# Patient Record
Sex: Male | Born: 1935 | Race: White | Hispanic: No | Marital: Single | Smoking: Former smoker
Health system: Southern US, Community
[De-identification: ages and names within clinical notes are randomized; demographics above are authoritative.]

## PROBLEM LIST (undated history)

## (undated) DIAGNOSIS — Z7901 Long term (current) use of anticoagulants: Secondary | ICD-10-CM

## (undated) DIAGNOSIS — G8911 Acute pain due to trauma: Secondary | ICD-10-CM

## (undated) DIAGNOSIS — J90 Pleural effusion, not elsewhere classified: Secondary | ICD-10-CM

## (undated) DIAGNOSIS — D649 Anemia, unspecified: Secondary | ICD-10-CM

## (undated) DIAGNOSIS — S72001A Fracture of unspecified part of neck of right femur, initial encounter for closed fracture: Secondary | ICD-10-CM

## (undated) DIAGNOSIS — R600 Localized edema: Secondary | ICD-10-CM

## (undated) DIAGNOSIS — F329 Major depressive disorder, single episode, unspecified: Secondary | ICD-10-CM

## (undated) DIAGNOSIS — H9190 Unspecified hearing loss, unspecified ear: Secondary | ICD-10-CM

## (undated) DIAGNOSIS — I519 Heart disease, unspecified: Secondary | ICD-10-CM

## (undated) DIAGNOSIS — S72001D Fracture of unspecified part of neck of right femur, subsequent encounter for closed fracture with routine healing: Secondary | ICD-10-CM

## (undated) DIAGNOSIS — R5381 Other malaise: Secondary | ICD-10-CM

## (undated) DIAGNOSIS — I10 Essential (primary) hypertension: Secondary | ICD-10-CM

## (undated) DIAGNOSIS — H409 Unspecified glaucoma: Secondary | ICD-10-CM

## (undated) DIAGNOSIS — I499 Cardiac arrhythmia, unspecified: Secondary | ICD-10-CM

## (undated) DIAGNOSIS — I4891 Unspecified atrial fibrillation: Secondary | ICD-10-CM

## (undated) DIAGNOSIS — F32A Depression, unspecified: Secondary | ICD-10-CM

## (undated) DIAGNOSIS — I639 Cerebral infarction, unspecified: Secondary | ICD-10-CM

## (undated) DIAGNOSIS — Z950 Presence of cardiac pacemaker: Secondary | ICD-10-CM

## (undated) DIAGNOSIS — I350 Nonrheumatic aortic (valve) stenosis: Secondary | ICD-10-CM

## (undated) DIAGNOSIS — F23 Brief psychotic disorder: Secondary | ICD-10-CM

## (undated) DIAGNOSIS — D62 Acute posthemorrhagic anemia: Secondary | ICD-10-CM

## (undated) DIAGNOSIS — J189 Pneumonia, unspecified organism: Secondary | ICD-10-CM

## (undated) DIAGNOSIS — E876 Hypokalemia: Secondary | ICD-10-CM

## (undated) HISTORY — DX: Unspecified glaucoma: H40.9

## (undated) HISTORY — DX: Acute posthemorrhagic anemia: D62

## (undated) HISTORY — DX: Depression, unspecified: F32.A

## (undated) HISTORY — DX: Unspecified atrial fibrillation: I48.91

## (undated) HISTORY — PX: OTHER SURGICAL HISTORY: SHX169

## (undated) HISTORY — DX: Long term (current) use of anticoagulants: Z79.01

## (undated) HISTORY — PX: JOINT REPLACEMENT: SHX530

## (undated) HISTORY — DX: Brief psychotic disorder: F23

## (undated) HISTORY — DX: Localized edema: R60.0

## (undated) HISTORY — DX: Acute pain due to trauma: G89.11

## (undated) HISTORY — DX: Other malaise: R53.81

## (undated) HISTORY — DX: Pleural effusion, not elsewhere classified: J90

## (undated) HISTORY — DX: Pneumonia, unspecified organism: J18.9

## (undated) HISTORY — DX: Fracture of unspecified part of neck of right femur, subsequent encounter for closed fracture with routine healing: S72.001D

## (undated) HISTORY — DX: Hypokalemia: E87.6

## (undated) HISTORY — DX: Major depressive disorder, single episode, unspecified: F32.9

## (undated) HISTORY — DX: Nonrheumatic aortic (valve) stenosis: I35.0

---

## 2013-04-21 HISTORY — PX: INSERT / REPLACE / REMOVE PACEMAKER: SUR710

## 2015-09-11 ENCOUNTER — Inpatient Hospital Stay (HOSPITAL_COMMUNITY)
Admission: EM | Admit: 2015-09-11 | Discharge: 2015-09-15 | DRG: 481 | Disposition: A | Discharge status: Skilled Nursing Facility | Payer: Medicare Other | Attending: Internal Medicine | Admitting: Internal Medicine

## 2015-09-11 ENCOUNTER — Emergency Department (HOSPITAL_COMMUNITY): Payer: Medicare Other

## 2015-09-11 ENCOUNTER — Encounter (HOSPITAL_COMMUNITY): Payer: Self-pay | Admitting: Emergency Medicine

## 2015-09-11 DIAGNOSIS — Y92092 Bedroom in other non-institutional residence as the place of occurrence of the external cause: Secondary | ICD-10-CM | POA: Diagnosis not present

## 2015-09-11 DIAGNOSIS — I493 Ventricular premature depolarization: Secondary | ICD-10-CM | POA: Diagnosis present

## 2015-09-11 DIAGNOSIS — I10 Essential (primary) hypertension: Secondary | ICD-10-CM | POA: Diagnosis not present

## 2015-09-11 DIAGNOSIS — J90 Pleural effusion, not elsewhere classified: Secondary | ICD-10-CM | POA: Diagnosis present

## 2015-09-11 DIAGNOSIS — Z419 Encounter for procedure for purposes other than remedying health state, unspecified: Secondary | ICD-10-CM

## 2015-09-11 DIAGNOSIS — D500 Iron deficiency anemia secondary to blood loss (chronic): Secondary | ICD-10-CM | POA: Diagnosis present

## 2015-09-11 DIAGNOSIS — Z8673 Personal history of transient ischemic attack (TIA), and cerebral infarction without residual deficits: Secondary | ICD-10-CM | POA: Diagnosis not present

## 2015-09-11 DIAGNOSIS — I110 Hypertensive heart disease with heart failure: Secondary | ICD-10-CM | POA: Diagnosis present

## 2015-09-11 DIAGNOSIS — F0390 Unspecified dementia without behavioral disturbance: Secondary | ICD-10-CM | POA: Diagnosis present

## 2015-09-11 DIAGNOSIS — I481 Persistent atrial fibrillation: Secondary | ICD-10-CM | POA: Diagnosis not present

## 2015-09-11 DIAGNOSIS — I350 Nonrheumatic aortic (valve) stenosis: Secondary | ICD-10-CM | POA: Diagnosis present

## 2015-09-11 DIAGNOSIS — D72819 Decreased white blood cell count, unspecified: Secondary | ICD-10-CM | POA: Diagnosis present

## 2015-09-11 DIAGNOSIS — I5032 Chronic diastolic (congestive) heart failure: Secondary | ICD-10-CM | POA: Diagnosis present

## 2015-09-11 DIAGNOSIS — D696 Thrombocytopenia, unspecified: Secondary | ICD-10-CM | POA: Diagnosis present

## 2015-09-11 DIAGNOSIS — Z96653 Presence of artificial knee joint, bilateral: Secondary | ICD-10-CM | POA: Diagnosis present

## 2015-09-11 DIAGNOSIS — I2510 Atherosclerotic heart disease of native coronary artery without angina pectoris: Secondary | ICD-10-CM | POA: Diagnosis present

## 2015-09-11 DIAGNOSIS — Z955 Presence of coronary angioplasty implant and graft: Secondary | ICD-10-CM

## 2015-09-11 DIAGNOSIS — S72001A Fracture of unspecified part of neck of right femur, initial encounter for closed fracture: Secondary | ICD-10-CM | POA: Diagnosis not present

## 2015-09-11 DIAGNOSIS — I480 Paroxysmal atrial fibrillation: Secondary | ICD-10-CM | POA: Diagnosis not present

## 2015-09-11 DIAGNOSIS — S0001XA Abrasion of scalp, initial encounter: Secondary | ICD-10-CM | POA: Diagnosis present

## 2015-09-11 DIAGNOSIS — S72001S Fracture of unspecified part of neck of right femur, sequela: Secondary | ICD-10-CM | POA: Diagnosis not present

## 2015-09-11 DIAGNOSIS — H9190 Unspecified hearing loss, unspecified ear: Secondary | ICD-10-CM | POA: Diagnosis present

## 2015-09-11 DIAGNOSIS — I517 Cardiomegaly: Secondary | ICD-10-CM | POA: Diagnosis not present

## 2015-09-11 DIAGNOSIS — Z87891 Personal history of nicotine dependence: Secondary | ICD-10-CM | POA: Diagnosis not present

## 2015-09-11 DIAGNOSIS — D62 Acute posthemorrhagic anemia: Secondary | ICD-10-CM | POA: Diagnosis not present

## 2015-09-11 DIAGNOSIS — T148XXA Other injury of unspecified body region, initial encounter: Secondary | ICD-10-CM

## 2015-09-11 DIAGNOSIS — S72141A Displaced intertrochanteric fracture of right femur, initial encounter for closed fracture: Principal | ICD-10-CM | POA: Diagnosis present

## 2015-09-11 DIAGNOSIS — I4891 Unspecified atrial fibrillation: Secondary | ICD-10-CM | POA: Diagnosis present

## 2015-09-11 DIAGNOSIS — W06XXXA Fall from bed, initial encounter: Secondary | ICD-10-CM | POA: Diagnosis present

## 2015-09-11 DIAGNOSIS — I272 Other secondary pulmonary hypertension: Secondary | ICD-10-CM | POA: Diagnosis present

## 2015-09-11 DIAGNOSIS — Z7901 Long term (current) use of anticoagulants: Secondary | ICD-10-CM

## 2015-09-11 DIAGNOSIS — E876 Hypokalemia: Secondary | ICD-10-CM | POA: Diagnosis not present

## 2015-09-11 DIAGNOSIS — J811 Chronic pulmonary edema: Secondary | ICD-10-CM | POA: Diagnosis present

## 2015-09-11 DIAGNOSIS — Z01810 Encounter for preprocedural cardiovascular examination: Secondary | ICD-10-CM | POA: Diagnosis not present

## 2015-09-11 DIAGNOSIS — Z950 Presence of cardiac pacemaker: Secondary | ICD-10-CM | POA: Diagnosis present

## 2015-09-11 DIAGNOSIS — I482 Chronic atrial fibrillation: Secondary | ICD-10-CM | POA: Diagnosis present

## 2015-09-11 DIAGNOSIS — S0990XA Unspecified injury of head, initial encounter: Secondary | ICD-10-CM

## 2015-09-11 DIAGNOSIS — G8911 Acute pain due to trauma: Secondary | ICD-10-CM | POA: Diagnosis present

## 2015-09-11 DIAGNOSIS — Z5181 Encounter for therapeutic drug level monitoring: Secondary | ICD-10-CM | POA: Diagnosis not present

## 2015-09-11 DIAGNOSIS — I270 Primary pulmonary hypertension: Secondary | ICD-10-CM | POA: Diagnosis not present

## 2015-09-11 DIAGNOSIS — E8770 Fluid overload, unspecified: Secondary | ICD-10-CM | POA: Diagnosis present

## 2015-09-11 DIAGNOSIS — M25551 Pain in right hip: Secondary | ICD-10-CM | POA: Diagnosis present

## 2015-09-11 DIAGNOSIS — S72001D Fracture of unspecified part of neck of right femur, subsequent encounter for closed fracture with routine healing: Secondary | ICD-10-CM | POA: Diagnosis not present

## 2015-09-11 HISTORY — DX: Essential (primary) hypertension: I10

## 2015-09-11 HISTORY — DX: Unspecified hearing loss, unspecified ear: H91.90

## 2015-09-11 HISTORY — DX: Presence of cardiac pacemaker: Z95.0

## 2015-09-11 HISTORY — DX: Fracture of unspecified part of neck of right femur, initial encounter for closed fracture: S72.001A

## 2015-09-11 HISTORY — DX: Cardiac arrhythmia, unspecified: I49.9

## 2015-09-11 HISTORY — DX: Cerebral infarction, unspecified: I63.9

## 2015-09-11 HISTORY — DX: Heart disease, unspecified: I51.9

## 2015-09-11 HISTORY — DX: Anemia, unspecified: D64.9

## 2015-09-11 LAB — BASIC METABOLIC PANEL
Anion gap: 8 (ref 5–15)
BUN: 17 mg/dL (ref 6–20)
CALCIUM: 8.8 mg/dL — AB (ref 8.9–10.3)
CO2: 24 mmol/L (ref 22–32)
CREATININE: 0.77 mg/dL (ref 0.61–1.24)
Chloride: 106 mmol/L (ref 101–111)
GFR calc non Af Amer: >60 mL/min (ref >60)
GLUCOSE: 97 mg/dL (ref 65–99)
Potassium: 4.7 mmol/L (ref 3.5–5.1)
Sodium: 138 mmol/L (ref 135–145)

## 2015-09-11 LAB — CBC WITH DIFFERENTIAL/PLATELET
BASOS PCT: 1 %
Basophils Absolute: 0 10*3/uL (ref 0.0–0.1)
EOS ABS: 0.1 10*3/uL (ref 0.0–0.7)
Eosinophils Relative: 3 %
HCT: 35.2 % — ABNORMAL LOW (ref 39.0–52.0)
Hemoglobin: 11.6 g/dL — ABNORMAL LOW (ref 13.0–17.0)
Lymphocytes Relative: 37 %
Lymphs Abs: 1.3 10*3/uL (ref 0.7–4.0)
MCH: 30.7 pg (ref 26.0–34.0)
MCHC: 33 g/dL (ref 30.0–36.0)
MCV: 93.1 fL (ref 78.0–100.0)
MONO ABS: 0.2 10*3/uL (ref 0.1–1.0)
MONOS PCT: 6 %
NEUTROS PCT: 53 %
Neutro Abs: 1.9 10*3/uL (ref 1.7–7.7)
PLATELETS: 126 10*3/uL — AB (ref 150–400)
RBC: 3.78 MIL/uL — ABNORMAL LOW (ref 4.22–5.81)
RDW: 15.1 % (ref 11.5–15.5)
WBC: 3.6 10*3/uL — ABNORMAL LOW (ref 4.0–10.5)

## 2015-09-11 LAB — BRAIN NATRIURETIC PEPTIDE: B Natriuretic Peptide: 657.7 pg/mL — ABNORMAL HIGH (ref 0.0–100.0)

## 2015-09-11 LAB — PROTIME-INR
INR: 1.26 (ref 0.00–1.49)
PROTHROMBIN TIME: 16 s — AB (ref 11.6–15.2)

## 2015-09-11 MED ORDER — ALUM & MAG HYDROXIDE-SIMETH 200-200-20 MG/5ML PO SUSP: 30.0000 mL | Freq: Four times a day (QID) | ORAL | Status: DC | PRN

## 2015-09-11 MED ORDER — TIMOLOL MALEATE 0.5 % OP SOLN
1.0000 [drp] | Freq: Every day | OPHTHALMIC | Status: DC
  Administered 2015-09-11 – 2015-09-15 (×4): 1 [drp] via OPHTHALMIC
  Filled 2015-09-11: qty 5

## 2015-09-11 MED ORDER — LISINOPRIL 20 MG PO TABS
20.0000 mg | ORAL_TABLET | Freq: Every day | ORAL | Status: DC
  Administered 2015-09-11 – 2015-09-15 (×4): 20 mg via ORAL
  Filled 2015-09-11 (×4): qty 1

## 2015-09-11 MED ORDER — ONDANSETRON HCL 4 MG/2ML IJ SOLN: 4.0000 mg | Freq: Four times a day (QID) | INTRAMUSCULAR | Status: DC | PRN

## 2015-09-11 MED ORDER — VITAMIN K1 10 MG/ML IJ SOLN: 10.0000 mg | Freq: Once | INTRAVENOUS | Status: DC

## 2015-09-11 MED ORDER — MORPHINE SULFATE (PF) 2 MG/ML IV SOLN
1.0000 mg | INTRAVENOUS | Status: DC | PRN
  Administered 2015-09-12: 2 mg via INTRAVENOUS
  Filled 2015-09-11: qty 1

## 2015-09-11 MED ORDER — MORPHINE SULFATE (PF) 2 MG/ML IV SOLN: 1.0000 mg | INTRAVENOUS | Status: DC | PRN

## 2015-09-11 MED ORDER — CEFAZOLIN SODIUM-DEXTROSE 2-3 GM-% IV SOLR
2.0000 g | Freq: Once | INTRAVENOUS | Status: DC
  Filled 2015-09-11 (×2): qty 50

## 2015-09-11 MED ORDER — SODIUM CHLORIDE 0.9 % IV SOLN: INTRAVENOUS | Status: DC

## 2015-09-11 MED ORDER — ACETAMINOPHEN 650 MG RE SUPP: 650.0000 mg | Freq: Four times a day (QID) | RECTAL | Status: DC | PRN

## 2015-09-11 MED ORDER — FUROSEMIDE 20 MG PO TABS: 20.0000 mg | ORAL_TABLET | Freq: Every day | ORAL | Status: DC | PRN

## 2015-09-11 MED ORDER — MORPHINE SULFATE (PF) 4 MG/ML IV SOLN
4.0000 mg | Freq: Once | INTRAVENOUS | Status: AC
  Administered 2015-09-11: 4 mg via INTRAVENOUS
  Filled 2015-09-11: qty 1

## 2015-09-11 MED ORDER — HEPARIN SODIUM (PORCINE) 5000 UNIT/ML IJ SOLN
5000.0000 [IU] | Freq: Three times a day (TID) | INTRAMUSCULAR | Status: AC
  Administered 2015-09-11 – 2015-09-12 (×5): 5000 [IU] via SUBCUTANEOUS
  Filled 2015-09-11 (×5): qty 1

## 2015-09-11 MED ORDER — CEFAZOLIN SODIUM-DEXTROSE 2-3 GM-% IV SOLR: 2.0000 g | Freq: Once | INTRAVENOUS | Status: DC

## 2015-09-11 MED ORDER — SODIUM CHLORIDE 0.9 % IV SOLN
INTRAVENOUS | Status: DC
  Administered 2015-09-11: 13:00:00 via INTRAVENOUS

## 2015-09-11 MED ORDER — HYDROCODONE-ACETAMINOPHEN 5-325 MG PO TABS: 1.0000 | ORAL_TABLET | ORAL | Status: DC | PRN

## 2015-09-11 MED ORDER — SENNOSIDES-DOCUSATE SODIUM 8.6-50 MG PO TABS: 1.0000 | ORAL_TABLET | Freq: Every evening | ORAL | Status: DC | PRN

## 2015-09-11 MED ORDER — ACETAMINOPHEN 325 MG PO TABS
650.0000 mg | ORAL_TABLET | Freq: Four times a day (QID) | ORAL | Status: DC | PRN
  Administered 2015-09-11: 650 mg via ORAL
  Filled 2015-09-11: qty 2

## 2015-09-11 MED ORDER — BISACODYL 5 MG PO TBEC: 5.0000 mg | DELAYED_RELEASE_TABLET | Freq: Every day | ORAL | Status: DC | PRN

## 2015-09-11 MED ORDER — SERTRALINE HCL 50 MG PO TABS
50.0000 mg | ORAL_TABLET | Freq: Every day | ORAL | Status: DC
  Administered 2015-09-11 – 2015-09-15 (×4): 50 mg via ORAL
  Filled 2015-09-11 (×4): qty 1

## 2015-09-11 MED ORDER — ONDANSETRON HCL 4 MG PO TABS: 4.0000 mg | ORAL_TABLET | Freq: Four times a day (QID) | ORAL | Status: DC | PRN

## 2015-09-11 MED ORDER — SODIUM CHLORIDE 0.9% FLUSH
3.0000 mL | Freq: Two times a day (BID) | INTRAVENOUS | Status: DC
  Administered 2015-09-11 – 2015-09-15 (×4): 3 mL via INTRAVENOUS

## 2015-09-11 NOTE — Progress Notes (Signed)
Utilization Review Completed.Timothy Joyce T2/20/2017  

## 2015-09-11 NOTE — ED Notes (Signed)
Per EMS, pt suffered a fall from bedside at his assisted living facility. Pt c/o pain to R hip & knee. Pt reports having hit his head but denies LOC or head/neck pain. Pt has on demand pacer. Hx bilat knee replacements.

## 2015-09-11 NOTE — Consult Note (Signed)
ORTHOPAEDIC CONSULTATION  REQUESTING PHYSICIAN: Marlowe Kays  Chief Complaint: Right hip fracture  HPI: Timothy Joyce is a 82 y.o. male who presents with right hip fracture s/p mechanical fall.  The patient endorses severe pain in the right hip, that does not radiate, grinding in quality, worse with any movement, better with immobilization.  Denies LOC/fever/chills/nausea/vomiting.  Walks with assistive devices (walker, cane, wheelchair).  Does live at [facility].  Past Medical History  Diagnosis Date  . Anemia   . Hypertension   . Heart disease   . Hearing loss   . Irregular heartbeat   . Stroke (HCC)   . Fracture of right hip [facility])    Past Surgical History  Procedure Laterality Date  . Joint replacement    . Right and left knee replacements     Social History   Social History  . Marital Status: Single    Spouse Name: N/A  . Number of Children: N/A  . Years of Education: N/A   Social History Main Topics  . Smoking status: Former Games developer  . Smokeless tobacco: Never Used  . Alcohol Use: No  . Drug Use: No  . Sexual Activity: Not Asked   Other Topics Concern  . None   Social History Narrative  . None   History reviewed. No pertinent family history. No Known Allergies Prior to Admission medications   Medication Sig Start Date End Date Taking? Authorizing Provider  furosemide (LASIX) 20 MG tablet Take 20 mg by mouth daily as needed for fluid.   Yes Historical Provider, MD  lisinopril (PRINIVIL,ZESTRIL) 20 MG tablet Take 20 mg by mouth daily.   Yes Historical Provider, MD  potassium chloride (K-DUR) 10 MEQ tablet Take 10 mEq by mouth daily.   Yes Historical Provider, MD  sertraline (ZOLOFT) 50 MG tablet Take 50 mg by mouth daily.   Yes Historical Provider, MD  timolol (BETIMOL) 0.5 % ophthalmic solution Place 1 drop into the right eye daily.   Yes Historical Provider, MD  warfarin (COUMADIN) 1 MG tablet Take 1 mg by mouth daily.   Yes Historical  Provider, MD  warfarin (COUMADIN) 5 MG tablet Take 5 mg by mouth daily.   Yes Historical Provider, MD   Dg Chest 1 View  09/11/2015  CLINICAL DATA:  Status post fall, with concern for chest injury. Initial encounter. EXAM: CHEST 1 VIEW COMPARISON:  None. FINDINGS: The lungs are well-aerated. A small right pleural effusion is noted. The left costophrenic angle is incompletely imaged on this study. Vascular congestion is seen, with increased interstitial markings, concerning for mild pulmonary edema. No pneumothorax is seen. The cardiomediastinal silhouette is enlarged. A pacemaker is noted, with a single lead ending at the right ventricle. No acute osseous abnormalities are seen. IMPRESSION: Small right pleural effusion. Vascular congestion and cardiomegaly. Increased interstitial markings raise concern for mild pulmonary edema. Electronically Signed   By: Roanna Raider M.D.   On: 09/11/2015 06:06   Ct Head Wo Contrast  09/11/2015  CLINICAL DATA:  Status post fall, hitting back of head. Patient on Coumadin. Headache. Initial encounter. EXAM: CT HEAD WITHOUT CONTRAST TECHNIQUE: Contiguous axial images were obtained from the base of the skull through the vertex without intravenous contrast. COMPARISON:  None. FINDINGS: There is no evidence of acute infarction, mass lesion, or intra- or extra-axial hemorrhage on CT. Prominence of the ventricles and sulci reflects mild to moderate cortical volume loss. Diffuse periventricular and subcortical white matter change likely reflects small vessel  ischemic microangiopathy. Cerebellar atrophy is noted. Mild chronic ischemic change is noted at the right basal ganglia. The brainstem and fourth ventricle are within normal limits. The cerebral hemispheres demonstrate grossly normal gray-white differentiation. No mass effect or midline shift is seen. There is no evidence of fracture; visualized osseous structures are unremarkable in appearance. The orbits are within normal  limits. Mucoperiosteal thickening is noted at the sphenoid sinus. The remaining paranasal sinuses and mastoid air cells are well-aerated. No significant soft tissue abnormalities are seen. IMPRESSION: 1. No evidence of traumatic intracranial injury or fracture. 2. Mild to moderate cortical volume and diffuse small vessel ischemic microangiopathy. 3. Mild chronic ischemic change at the right basal ganglia. 4. Mucoperiosteal thickening at the sphenoid sinus. Electronically Signed   By: Roanna Raider M.D.   On: 09/11/2015 07:07   Dg Knee Complete 4 Views Right  09/11/2015  CLINICAL DATA:  Status post fall, with right hip pain. Initial encounter. EXAM: RIGHT KNEE - COMPLETE 4+ VIEW COMPARISON:  None. FINDINGS: There is no evidence of fracture or dislocation. The patient's total knee arthroplasty is grossly unremarkable in appearance, without evidence of loosening. The joint spaces are grossly preserved. No significant degenerative change is seen; the patellofemoral joint is grossly unremarkable in appearance. No significant joint effusion is seen. Diffuse vascular calcifications are seen. IMPRESSION: 1. No evidence of fracture or dislocation. Total knee arthroplasty is grossly unremarkable. 2. Diffuse vascular calcifications seen. Electronically Signed   By: Roanna Raider M.D.   On: 09/11/2015 06:01   Dg Hip Unilat  With Pelvis 2-3 Views Right  09/11/2015  CLINICAL DATA:  Status post fall, with right hip pain. Initial encounter. EXAM: DG HIP (WITH OR WITHOUT PELVIS) 2-3V RIGHT COMPARISON:  None. FINDINGS: There is a mildly displaced right femoral intertrochanteric fracture, with mild medial angulation. No additional fractures are seen. The right femoral head remains seated at the acetabulum. Axial joint space narrowing is noted at the left hip. The sacroiliac joints are grossly unremarkable. Mild degenerative change is noted at the lower lumbar spine. Diffuse vascular calcifications are seen. Mild chronic  deformity is noted at the right superior and inferior pubic rami. IMPRESSION: 1. Mildly displaced right femoral intertrochanteric fracture, with mild medial angulation. 2. Diffuse vascular calcifications seen. Electronically Signed   By: Roanna Raider M.D.   On: 09/11/2015 05:55    All pertinent xrays, MRI, CT independently reviewed and interpreted  Positive ROS: All other systems have been reviewed and were otherwise negative with the exception of those mentioned in the HPI and as above.  Physical Exam: General: no acute distress, hard of hearing Cardiovascular: No pedal edema Respiratory: No cyanosis, no use of accessory musculature GI: No organomegaly, abdomen is soft and non-tender Skin: No lesions in the area of chief complaint Neurologic: Sensation intact distally Psychiatric: Patient is hard of hearing but normal psych Lymphatic: No axillary or cervical lymphadenopathy  MUSCULOSKELETAL:  - severe pain with movement of the hip and extremity - skin intact - NVI distally - compartments soft  Assessment: Right hip fracture  Plan: - surgery is recommended, patient and family are aware of r/b/a and wish to proceed - consent obtained from son who is HCPOA - medical optimization per primary team - surgery is planned for Wednesday - recommend cardiac eval preop given prior CVA and afib - sub q heparin for DVT ppx for now  Thank you for the consult and the opportunity to see Mr. Brydon Spahr. Glee Arvin, MD [facility]  [phone] 5:15 PM

## 2015-09-11 NOTE — Progress Notes (Signed)
Palliative Medicine received a consultation request from ER RN due to a previous initiative called SOS. Patient is going to surgery today.  If Primary attending would like for PM to consult please place an order to do so and we will be glad to see Timothy Joyce. Thank you  Algis Downs, [place] [facility] Pager: [phone]

## 2015-09-11 NOTE — ED Provider Notes (Signed)
CSN: [id number]     Arrival date & time 09/11/15  0444 History   First MD Initiated Contact with Patient 09/11/15 0601     Chief Complaint  Patient presents with  . Fall  . Hip Pain  . Knee Pain     (Consider location/radiation/quality/duration/timing/severity/associated sxs/prior Treatment) HPI Comments: Patient is an 82 year old male with history of hypertension, atrial fibrillation, prior CVA. He presents for evaluation of a right hip injury. He got up in the night to go to the bathroom and fell. He did strike the back of his head, however denies loss of consciousness or headache. He denies other injury. He was unable to get up and walk and EMS was called and transported him here.  Patient is a 82 y.o. male presenting with fall. The history is provided by the patient.  Fall This is a new problem. The current episode started less than 1 hour ago. The problem occurs constantly. The problem has not changed since onset.Associated symptoms comments: Hip pain. Exacerbated by: Movement and palpation. He has tried nothing for the symptoms. The treatment provided no relief.    Past Medical History  Diagnosis Date  . Anemia   . Hypertension   . Heart disease   . Hearing loss   . Irregular heartbeat   . Stroke [facility])    Past Surgical History  Procedure Laterality Date  . Joint replacement     History reviewed. No pertinent family history. Social History  Substance Use Topics  . Smoking status: Former Games developer  . Smokeless tobacco: Never Used  . Alcohol Use: No    Review of Systems  All other systems reviewed and are negative.     Allergies  Review of patient's allergies indicates no known allergies.  Home Medications   Prior to Admission medications   Not on File   BP 156/96 mmHg  Pulse 69  Temp(Src) 97.5 F (36.4 C) (Oral)  Resp 20  SpO2 99% Physical Exam  Constitutional: He is oriented to person, place, and time. He appears well-developed and well-nourished. No  distress.  HENT:  Head: Atraumatic.  There is an abrasion to the occiput.  Eyes: EOM are normal. Pupils are equal, round, and reactive to light.  Neck: Normal range of motion. Neck supple.  There is no C-spine tenderness. He has good range of motion without discomfort.  Cardiovascular: Normal rate, regular rhythm and normal heart sounds.   No murmur heard. Pulmonary/Chest: Effort normal and breath sounds normal. No respiratory distress. He has no wheezes. He has no rales.  Abdominal: Soft. Bowel sounds are normal. He exhibits no distension. There is no tenderness.  Musculoskeletal: Normal range of motion.  There is tenderness over the right lateral hip. He has pain with any range of motion of the hip joint. The leg is somewhat shortened and internally rotated. DP and PT pulses are palpable and sensation is intact to the entire foot.  Lymphadenopathy:    He has no cervical adenopathy.  Neurological: He is alert and oriented to person, place, and time.  Skin: Skin is warm and dry. He is not diaphoretic.  Nursing note and vitals reviewed.   ED Course  Procedures (including critical care time) Labs Review Labs Reviewed  BASIC METABOLIC PANEL  CBC WITH DIFFERENTIAL/PLATELET  PROTIME-INR    Imaging Review Dg Chest 1 View  09/11/2015  CLINICAL DATA:  Status post fall, with concern for chest injury. Initial encounter. EXAM: CHEST 1 VIEW COMPARISON:  None. FINDINGS: The lungs are  well-aerated. A small right pleural effusion is noted. The left costophrenic angle is incompletely imaged on this study. Vascular congestion is seen, with increased interstitial markings, concerning for mild pulmonary edema. No pneumothorax is seen. The cardiomediastinal silhouette is enlarged. A pacemaker is noted, with a single lead ending at the right ventricle. No acute osseous abnormalities are seen. IMPRESSION: Small right pleural effusion. Vascular congestion and cardiomegaly. Increased interstitial markings  raise concern for mild pulmonary edema. Electronically Signed   By: Roanna Raider M.D.   On: 09/11/2015 06:06   Dg Knee Complete 4 Views Right  09/11/2015  CLINICAL DATA:  Status post fall, with right hip pain. Initial encounter. EXAM: RIGHT KNEE - COMPLETE 4+ VIEW COMPARISON:  None. FINDINGS: There is no evidence of fracture or dislocation. The patient's total knee arthroplasty is grossly unremarkable in appearance, without evidence of loosening. The joint spaces are grossly preserved. No significant degenerative change is seen; the patellofemoral joint is grossly unremarkable in appearance. No significant joint effusion is seen. Diffuse vascular calcifications are seen. IMPRESSION: 1. No evidence of fracture or dislocation. Total knee arthroplasty is grossly unremarkable. 2. Diffuse vascular calcifications seen. Electronically Signed   By: Roanna Raider M.D.   On: 09/11/2015 06:01   Dg Hip Unilat  With Pelvis 2-3 Views Right  09/11/2015  CLINICAL DATA:  Status post fall, with right hip pain. Initial encounter. EXAM: DG HIP (WITH OR WITHOUT PELVIS) 2-3V RIGHT COMPARISON:  None. FINDINGS: There is a mildly displaced right femoral intertrochanteric fracture, with mild medial angulation. No additional fractures are seen. The right femoral head remains seated at the acetabulum. Axial joint space narrowing is noted at the left hip. The sacroiliac joints are grossly unremarkable. Mild degenerative change is noted at the lower lumbar spine. Diffuse vascular calcifications are seen. Mild chronic deformity is noted at the right superior and inferior pubic rami. IMPRESSION: 1. Mildly displaced right femoral intertrochanteric fracture, with mild medial angulation. 2. Diffuse vascular calcifications seen. Electronically Signed   By: Roanna Raider M.D.   On: 09/11/2015 05:55   I have personally reviewed and evaluated these images and lab results as part of my medical decision-making.  ED ECG REPORT   Date:  09/11/2015  Rate: 72  Rhythm: atrial fibrillation with occasional ventricular paced beats  QRS Axis: normal  Intervals: normal  ST/T Wave abnormalities: nonspecific T wave changes  Conduction Disutrbances:none  Narrative Interpretation:   Old EKG Reviewed: unchanged  I have personally reviewed the EKG tracing and agree with the computerized printout as noted.   MDM   Final diagnoses:  Fx    Patient presents here after a fall while getting up to go to the bathroom. His x-rays reveal an intertrochanteric fracture of the right hip. He also bumped his head and is taking Coumadin for atrial fibrillation. His head CT is negative and his neurologic exam is nonfocal. I've spoken with Dr. Roda Shutters from orthopedics who is recommending admission to medicine. [facility] will [facility] to the [facility] under the care of Dr. Konrad Dolores.    Geoffery Lyons, MD 09/11/15 [phone]

## 2015-09-11 NOTE — H&P (Signed)
[facility] History and Physical  Paula Zietz ZOX:[id number] DOB: 27-May-1926 DOA: 09/11/2015  Referring physician:  PCP: No primary care provider on file.   Chief Complaint: Hip Pfracture  HPI: Timothy Joyce is a 82 y.o. male with a history of atrial fibrillation  And prior CVA on Coumadin brought by EMS after falling early this morning. He got up to the bathroom sustaining a mechanical fall hitting his right hip, knee and right side of his head.apin was worse with movement and palpation.History is obtained by chart, as the patient is unable to provide further history due to pain meds used to control his pain. He denied any dizziness or LOC, headaches or neck pain. He denied any chest pain or SOB. Patient has a history of bilateral knee replacements; hip x ray showed mildly displaced right femoral intertrochanteric fracture, with mild medial angulation.   At the ED, he was afebrile, and his VS were stable. He received IV morphine and the right lower extremity was immobilized  with better control of symptoms. CT head negative for acute intracranial abnormality. Right knee x ray was negative. EKG was remarkable for atrial fibrillation with the patient and ventricular paced beats,unchanged from prior studies.CMET and CBC are essentially unremarkable. PT and INR are16 and 1.26 respectively.Surgery is anticipated on 09/12/15 by the Orthopedic team.  Review of Systems  Unable to perform ROS: other    Past Medical History  Diagnosis Date  . Anemia   . Hypertension   . Heart disease   . Hearing loss   . Irregular heartbeat   . Stroke (HCC)   . Fracture of right hip [facility])    Past Surgical History  Procedure Laterality Date  . Joint replacement    . Right and left knee replacements     Social History:  reports that he has quit smoking. He has never used smokeless tobacco. He reports that he does not drink alcohol or use illicit drugs. Livesin Assisted Living facility.   No Known  Allergies  History reviewed. No pertinent family history.   Prior to Admission medications   Not on File   Physical Exam: Filed Vitals:   09/11/15 0712 09/11/15 0714 09/11/15 0730 09/11/15 0800  BP: 150/74  154/81 145/98  Pulse:  69 70 74  Temp:      TempSrc:      Resp:  SpO2:  92% 91% 93%    Wt Readings from Last 3 Encounters:  No data found for Wt    Physical Exam  Constitutional:  Patient is somewhat lethargic due to pain control.  HENT:  Mouth/Throat: Oropharynx is clear and moist. No oropharyngeal exudate.  Bruising in the posterior region of his head.  Eyes: Conjunctivae are normal. Pupils are equal, round, and reactive to light. No scleral icterus.  Neck: Neck supple. No JVD present. No tracheal deviation present. No thyromegaly present.  Cardiovascular: Exam reveals no gallop and no friction rub.   Murmur heard. Irregularly regular. Left pacer noted.  Pulmonary/Chest: Effort normal and breath sounds normal. No respiratory distress. He has no wheezes. He has no rales. He exhibits no tenderness.  Abdominal: Soft. Bowel sounds are normal. He exhibits no distension and no mass. There is no tenderness.  Musculoskeletal: He exhibits edema and tenderness.  Right knee with some effusion due to fall, bandaged. Right hip tender to palpation.   Lymphadenopathy:    He has no cervical adenopathy.  Neurological: He exhibits normal muscle tone.  Unable to fully  interact due to sedation. He responds to sound and touch, cannot follow commands at this time.   Hard of hearing.  Skin: No rash noted. No erythema. No pallor.            Labs on Admission:  Basic Metabolic Panel:  Recent Labs Lab 09/11/15 0515  NA 138  K 4.7  CL 106  CO2 24  GLUCOSE 97  BUN 17  CREATININE 0.77  CALCIUM 8.8*    CBC:  Recent Labs Lab 09/11/15 0515  WBC 3.6*  NEUTROABS 1.9  HGB 11.6*  HCT 35.2*  MCV 93.1  PLT 126*    Cardiac Enzymes: No results for input(s):  CKTOTAL, CKMB, CKMBINDEX, TROPONINI in the last 168 hours.  CBG: No results for input(s): GLUCAP in the last 168 hours.  Radiological Exams on Admission: Dg Chest 1 View  09/11/2015  CLINICAL DATA:  Status post fall, with concern for chest injury. Initial encounter. EXAM: CHEST 1 VIEW COMPARISON:  None. FINDINGS: The lungs are well-aerated. A small right pleural effusion is noted. The left costophrenic angle is incompletely imaged on this study. Vascular congestion is seen, with increased interstitial markings, concerning for mild pulmonary edema. No pneumothorax is seen. The cardiomediastinal silhouette is enlarged. A pacemaker is noted, with a single lead ending at the right ventricle. No acute osseous abnormalities are seen. IMPRESSION: Small right pleural effusion. Vascular congestion and cardiomegaly. Increased interstitial markings raise concern for mild pulmonary edema. Electronically Signed   By: Roanna Raider M.D.   On: 09/11/2015 06:06   Ct Head Wo Contrast  09/11/2015  CLINICAL DATA:  Status post fall, hitting back of head. Patient on Coumadin. Headache. Initial encounter. EXAM: CT HEAD WITHOUT CONTRAST TECHNIQUE: Contiguous axial images were obtained from the base of the skull through the vertex without intravenous contrast. COMPARISON:  None. FINDINGS: There is no evidence of acute infarction, mass lesion, or intra- or extra-axial hemorrhage on CT. Prominence of the ventricles and sulci reflects mild to moderate cortical volume loss. Diffuse periventricular and subcortical white matter change likely reflects small vessel ischemic microangiopathy. Cerebellar atrophy is noted. Mild chronic ischemic change is noted at the right basal ganglia. The brainstem and fourth ventricle are within normal limits. The cerebral hemispheres demonstrate grossly normal gray-white differentiation. No mass effect or midline shift is seen. There is no evidence of fracture; visualized osseous structures are  unremarkable in appearance. The orbits are within normal limits. Mucoperiosteal thickening is noted at the sphenoid sinus. The remaining paranasal sinuses and mastoid air cells are well-aerated. No significant soft tissue abnormalities are seen. IMPRESSION: 1. No evidence of traumatic intracranial injury or fracture. 2. Mild to moderate cortical volume and diffuse small vessel ischemic microangiopathy. 3. Mild chronic ischemic change at the right basal ganglia. 4. Mucoperiosteal thickening at the sphenoid sinus. Electronically Signed   By: Roanna Raider M.D.   On: 09/11/2015 07:07   Dg Knee Complete 4 Views Right  09/11/2015  CLINICAL DATA:  Status post fall, with right hip pain. Initial encounter. EXAM: RIGHT KNEE - COMPLETE 4+ VIEW COMPARISON:  None. FINDINGS: There is no evidence of fracture or dislocation. The patient's total knee arthroplasty is grossly unremarkable in appearance, without evidence of loosening. The joint spaces are grossly preserved. No significant degenerative change is seen; the patellofemoral joint is grossly unremarkable in appearance. No significant joint effusion is seen. Diffuse vascular calcifications are seen. IMPRESSION: 1. No evidence of fracture or dislocation. Total knee arthroplasty is grossly unremarkable. 2. Diffuse vascular  calcifications seen. Electronically Signed   By: Roanna Raider M.D.   On: 09/11/2015 06:01   Dg Hip Unilat  With Pelvis 2-3 Views Right  09/11/2015  CLINICAL DATA:  Status post fall, with right hip pain. Initial encounter. EXAM: DG HIP (WITH OR WITHOUT PELVIS) 2-3V RIGHT COMPARISON:  None. FINDINGS: There is a mildly displaced right femoral intertrochanteric fracture, with mild medial angulation. No additional fractures are seen. The right femoral head remains seated at the acetabulum. Axial joint space narrowing is noted at the left hip. The sacroiliac joints are grossly unremarkable. Mild degenerative change is noted at the lower lumbar spine.  Diffuse vascular calcifications are seen. Mild chronic deformity is noted at the right superior and inferior pubic rami. IMPRESSION: 1. Mildly displaced right femoral intertrochanteric fracture, with mild medial angulation. 2. Diffuse vascular calcifications seen. Electronically Signed   By: Roanna Raider M.D.   On: 09/11/2015 05:55    EKG: remarkable for atrial fibrillation with the patient and ventricular paced beats, unchanged from prior studies.    Assessment/Plan Active Problems:   Closed right hip fracture (HCC)   Atrial fibrillation (HCC)   Hypertension   Pain, acute due to trauma   Fracture of right hip (HCC)   Closed Right Hip Fracture:  hip x ray showed mildly displaced right femoral intertrochanteric fracture, with mild medial angulation. Admit to tele bed Surgery planned for 2/21 by Ortho.  Chronic Atrial Fibrillation On Coumadin. PT and INR are16 and 1.26 respectively. Anticoagulation on hold due to anticipated surgery on 2/21. No Vit K is indicated at this time.  Continue SCDs   Hypertension. BP 150/74. Controlled.  Will continue his home meds with Lasix, Prinivil.  Acute right hip pain due to trauma. Currently controlled with  Morphine IV, will continue prn pain meds   Attestation regarding necessity of inpatient status:   The appropriate admission status for this patient is INPATIENT. Inpatient status is judged to be reasonable and necessary in order to provide the required intensity of service to ensure the patient's safety. The patient's presenting symptoms, physical exam findings, and initial radiographic and laboratory data in the context of their chronic comorbidities is felt to place them at high risk for further clinical deterioration. Furthermore, it is not anticipated that the patient will be medically stable for discharge from the hospital within 2 midnights of admission. The following factors support the admission status of inpatient.   -The patient's  presenting symptoms include right hip fracturewith severe pain . - The worrisome physical exam findings include right lower extremity trauma and swelling . - The initial radiographic and laboratory data are worrisome because of  There is a mildly displaced right femoral intertrochanteric fracture, with mild medial angulation.  - The chronic co-morbidities include A fib on Coumadin . - Patient requires inpatient status due to high intensity of service, high risk for further deterioration and high frequency of surveillance required. - I certify that at the point of admission it is my clinical judgment that the patient will require inpatient hospital care spanning beyond 2 midnights from the point of admission.     Code Status: Full Code  DVT Prophylaxis: SCDs anticipating surgery within next 24 hrs Family Communication: no one at bedside Disposition Plan: Pending Improvement.  Admitted to tele bed   [facility] E,PA-C [facility] [email or web address] Password [email or web address]

## 2015-09-11 NOTE — ED Notes (Signed)
Patient transported to X-ray 

## 2015-09-12 ENCOUNTER — Inpatient Hospital Stay (HOSPITAL_COMMUNITY): Payer: Medicare Other

## 2015-09-12 DIAGNOSIS — I517 Cardiomegaly: Secondary | ICD-10-CM

## 2015-09-12 DIAGNOSIS — I272 Other secondary pulmonary hypertension: Secondary | ICD-10-CM

## 2015-09-12 DIAGNOSIS — I482 Chronic atrial fibrillation: Secondary | ICD-10-CM

## 2015-09-12 DIAGNOSIS — Z01810 Encounter for preprocedural cardiovascular examination: Secondary | ICD-10-CM

## 2015-09-12 DIAGNOSIS — I270 Primary pulmonary hypertension: Secondary | ICD-10-CM

## 2015-09-12 DIAGNOSIS — I350 Nonrheumatic aortic (valve) stenosis: Secondary | ICD-10-CM

## 2015-09-12 LAB — CBC
HCT: 37.3 % — ABNORMAL LOW (ref 39.0–52.0)
Hemoglobin: 12.2 g/dL — ABNORMAL LOW (ref 13.0–17.0)
MCH: 30.4 pg (ref 26.0–34.0)
MCHC: 32.7 g/dL (ref 30.0–36.0)
MCV: 93 fL (ref 78.0–100.0)
PLATELETS: 97 10*3/uL — AB (ref 150–400)
RBC: 4.01 MIL/uL — ABNORMAL LOW (ref 4.22–5.81)
RDW: 15 % (ref 11.5–15.5)
WBC: 3.9 10*3/uL — AB (ref 4.0–10.5)

## 2015-09-12 LAB — BASIC METABOLIC PANEL
Anion gap: 10 (ref 5–15)
BUN: 18 mg/dL (ref 6–20)
CO2: 23 mmol/L (ref 22–32)
CREATININE: 0.85 mg/dL (ref 0.61–1.24)
Calcium: 8.7 mg/dL — ABNORMAL LOW (ref 8.9–10.3)
Chloride: 107 mmol/L (ref 101–111)
GFR calc Af Amer: >60 mL/min (ref >60)
Glucose, Bld: 89 mg/dL (ref 65–99)
Potassium: 3.8 mmol/L (ref 3.5–5.1)
SODIUM: 140 mmol/L (ref 135–145)

## 2015-09-12 LAB — PROTIME-INR
INR: 1.36 (ref 0.00–1.49)
Prothrombin Time: 16.9 seconds — ABNORMAL HIGH (ref 11.6–15.2)

## 2015-09-12 MED ORDER — FUROSEMIDE 10 MG/ML IJ SOLN
40.0000 mg | Freq: Once | INTRAMUSCULAR | Status: AC
  Administered 2015-09-12: 40 mg via INTRAVENOUS
  Filled 2015-09-12: qty 4

## 2015-09-12 NOTE — Progress Notes (Signed)
Patient is hard of hearing.  He is stable, VSSAF. Plan for surgery tomorrow. Medical and cardiac optimization today. NPO after midnight.  Mayra Reel, MD [facility] [phone] 7:56 AM

## 2015-09-12 NOTE — H&P (Signed)
H&P update  The surgical history has been reviewed and remains accurate without interval change.  The patient was re-examined and patient's physiologic condition has not changed significantly in the last 30 days. The condition still exists that makes this procedure necessary. The treatment plan remains the same, without new options for care.  No new pharmacological allergies or types of therapy has been initiated that would change the plan or the appropriateness of the plan.  The patient and/or family understand the potential benefits and risks.  N. Michael Desta Bujak, MD 09/12/2015 9:34 PM   

## 2015-09-12 NOTE — Consult Note (Addendum)
Cardiologist:  New to [facility]  Reason for Consult: Preop Clearance Referring Physician: Dr. Tomasita Crumble Timothy Joyce is an 82 y.o. male.  HPI:   The patient is an 82 yo male with a history of HTN, CAD, heart disease, Afib-on coumadin, CVA, Anemia, single chamber PPM.  He apparently had a stent in the 1990's.  No records available.  He presented after a fall Xray revealed a mildly displaced right femoral intertrochanteric fracture.    He had an echo this admission revealing an EF of 50-55%, normal wall motion, normal LV size.  mild AS however, visually the AV appears moderate to severely stenotic.  Severe diffuse thickening and calcification.  planimetered AVA iscalculated at 1cm2 consistent with moderate to severe AS.  aortic root is moderately dilated at the Sinuses ofValsalva. Aortic root dimension: 48 mm (ED).  Mild to moderate MR.  RV moderately dilated.  The RA is massively dilated.  Severe pulmonary HTN.  CXR revealed cardiomegaly, Small right pleural effusion. Vascular congestion, pulmonary edema.  BNP 657.7.  He is mostly V-paced.  Some PVCs.   Head CT negative for acute intracranial abnormality.    Past Medical History  Diagnosis Date  . Anemia   . Hypertension   . Heart disease   . Hearing loss   . Irregular heartbeat   . Stroke ([place])   . Fracture of right hip [facility])     Past Surgical History  Procedure Laterality Date  . Joint replacement    . Right and left knee replacements      Family History  Problem Relation Age of Onset  . Family history unknown: Yes    Social History:  reports that he has quit smoking. He has never used smokeless tobacco. He reports that he does not drink alcohol or use illicit drugs.  Allergies: No Known Allergies  Medications:  Scheduled Meds: .  ceFAZolin (ANCEF) IV  2 g Intravenous Once  . heparin subcutaneous  5,000 Units Subcutaneous 3 times per day  . lisinopril  20 mg Oral Daily  . sertraline  50 mg Oral Daily  . sodium chloride flush   3 mL Intravenous Q12H  . timolol  1 drop Right Eye Daily   Continuous Infusions:  PRN Meds:.acetaminophen **OR** acetaminophen, alum & mag hydroxide-simeth, bisacodyl, HYDROcodone-acetaminophen, morphine injection, ondansetron **OR** ondansetron (ZOFRAN) IV, senna-docusate   Results for orders placed or performed during the hospital encounter of 09/11/15 (from the past 48 hour(s))  Basic metabolic panel     Status: Abnormal   Collection Time: 09/11/15  5:15 AM  Result Value Ref Range   Sodium 138 135 - 145 mmol/L   Potassium 4.7 3.5 - 5.1 mmol/L    Comment: SPECIMEN HEMOLYZED. HEMOLYSIS MAY AFFECT INTEGRITY OF RESULTS.   Chloride 106 101 - 111 mmol/L   CO2 24 22 - 32 mmol/L   Glucose, Bld 97 65 - 99 mg/dL   BUN 17 6 - 20 mg/dL   Creatinine, Ser 0.77 0.61 - 1.24 mg/dL   Calcium 8.8 (L) 8.9 - 10.3 mg/dL   GFR calc non Af Amer >60 >60 mL/min   GFR calc Af Amer >60 >60 mL/min    Comment: (NOTE) The eGFR has been calculated using the CKD EPI equation. This calculation has not been validated in all clinical situations. eGFR's persistently <60 mL/min signify possible Chronic Kidney Disease.    Anion gap 8 5 - 15  CBC with Differential     Status: Abnormal   Collection  Time: 09/11/15  5:15 AM  Result Value Ref Range   WBC 3.6 (L) 4.0 - 10.5 K/uL   RBC 3.78 (L) 4.22 - 5.81 MIL/uL   Hemoglobin 11.6 (L) 13.0 - 17.0 g/dL   HCT 35.2 (L) 39.0 - 52.0 %   MCV 93.1 78.0 - 100.0 fL   MCH 30.7 26.0 - 34.0 pg   MCHC 33.0 30.0 - 36.0 g/dL   RDW 15.1 11.5 - 15.5 %   Platelets 126 (L) 150 - 400 K/uL   Neutrophils Relative % 53 %   Neutro Abs 1.9 1.7 - 7.7 K/uL   Lymphocytes Relative 37 %   Lymphs Abs 1.3 0.7 - 4.0 K/uL   Monocytes Relative 6 %   Monocytes Absolute 0.2 0.1 - 1.0 K/uL   Eosinophils Relative 3 %   Eosinophils Absolute 0.1 0.0 - 0.7 K/uL   Basophils Relative 1 %   Basophils Absolute 0.0 0.0 - 0.1 K/uL  Protime-INR     Status: Abnormal   Collection Time: 09/11/15  5:15 AM    Result Value Ref Range   Prothrombin Time 16.0 (H) 11.6 - 15.2 seconds   INR 1.26 0.00 - 1.49  Brain natriuretic peptide     Status: Abnormal   Collection Time: 09/11/15  7:42 PM  Result Value Ref Range   B Natriuretic Peptide 657.7 (H) 0.0 - 100.0 pg/mL  Basic metabolic panel     Status: Abnormal   Collection Time: 09/12/15  7:25 AM  Result Value Ref Range   Sodium 140 135 - 145 mmol/L   Potassium 3.8 3.5 - 5.1 mmol/L   Chloride 107 101 - 111 mmol/L   CO2 23 22 - 32 mmol/L   Glucose, Bld 89 65 - 99 mg/dL   BUN 18 6 - 20 mg/dL   Creatinine, Ser 0.85 0.61 - 1.24 mg/dL   Calcium 8.7 (L) 8.9 - 10.3 mg/dL   GFR calc non Af Amer >60 >60 mL/min   GFR calc Af Amer >60 >60 mL/min    Comment: (NOTE) The eGFR has been calculated using the CKD EPI equation. This calculation has not been validated in all clinical situations. eGFR's persistently <60 mL/min signify possible Chronic Kidney Disease.    Anion gap 10 5 - 15  CBC     Status: Abnormal   Collection Time: 09/12/15  7:25 AM  Result Value Ref Range   WBC 3.9 (L) 4.0 - 10.5 K/uL   RBC 4.01 (L) 4.22 - 5.81 MIL/uL   Hemoglobin 12.2 (L) 13.0 - 17.0 g/dL   HCT 37.3 (L) 39.0 - 52.0 %   MCV 93.0 78.0 - 100.0 fL   MCH 30.4 26.0 - 34.0 pg   MCHC 32.7 30.0 - 36.0 g/dL   RDW 15.0 11.5 - 15.5 %   Platelets 97 (L) 150 - 400 K/uL    Comment: SPECIMEN CHECKED FOR CLOTS PLATELET COUNT CONFIRMED BY SMEAR   Protime-INR     Status: Abnormal   Collection Time: 09/12/15  7:25 AM  Result Value Ref Range   Prothrombin Time 16.9 (H) 11.6 - 15.2 seconds   INR 1.36 0.00 - 1.49    Dg Chest 1 View  09/11/2015  CLINICAL DATA:  Status post fall, with concern for chest injury. Initial encounter. EXAM: CHEST 1 VIEW COMPARISON:  None. FINDINGS: The lungs are well-aerated. A small right pleural effusion is noted. The left costophrenic angle is incompletely imaged on this study. Vascular congestion is seen, with increased interstitial markings, concerning  for mild pulmonary edema. No pneumothorax is seen. The cardiomediastinal silhouette is enlarged. A pacemaker is noted, with a single lead ending at the right ventricle. No acute osseous abnormalities are seen. IMPRESSION: Small right pleural effusion. Vascular congestion and cardiomegaly. Increased interstitial markings raise concern for mild pulmonary edema. Electronically Signed   By: Garald Balding M.D.   On: 09/11/2015 06:06   Ct Head Wo Contrast  09/11/2015  CLINICAL DATA:  Status post fall, hitting back of head. Patient on Coumadin. Headache. Initial encounter. EXAM: CT HEAD WITHOUT CONTRAST TECHNIQUE: Contiguous axial images were obtained from the base of the skull through the vertex without intravenous contrast. COMPARISON:  None. FINDINGS: There is no evidence of acute infarction, mass lesion, or intra- or extra-axial hemorrhage on CT. Prominence of the ventricles and sulci reflects mild to moderate cortical volume loss. Diffuse periventricular and subcortical white matter change likely reflects small vessel ischemic microangiopathy. Cerebellar atrophy is noted. Mild chronic ischemic change is noted at the right basal ganglia. The brainstem and fourth ventricle are within normal limits. The cerebral hemispheres demonstrate grossly normal gray-white differentiation. No mass effect or midline shift is seen. There is no evidence of fracture; visualized osseous structures are unremarkable in appearance. The orbits are within normal limits. Mucoperiosteal thickening is noted at the sphenoid sinus. The remaining paranasal sinuses and mastoid air cells are well-aerated. No significant soft tissue abnormalities are seen. IMPRESSION: 1. No evidence of traumatic intracranial injury or fracture. 2. Mild to moderate cortical volume and diffuse small vessel ischemic microangiopathy. 3. Mild chronic ischemic change at the right basal ganglia. 4. Mucoperiosteal thickening at the sphenoid sinus. Electronically Signed    By: Garald Balding M.D.   On: 09/11/2015 07:07   Dg Knee Complete 4 Views Right  09/11/2015  CLINICAL DATA:  Status post fall, with right hip pain. Initial encounter. EXAM: RIGHT KNEE - COMPLETE 4+ VIEW COMPARISON:  None. FINDINGS: There is no evidence of fracture or dislocation. The patient's total knee arthroplasty is grossly unremarkable in appearance, without evidence of loosening. The joint spaces are grossly preserved. No significant degenerative change is seen; the patellofemoral joint is grossly unremarkable in appearance. No significant joint effusion is seen. Diffuse vascular calcifications are seen. IMPRESSION: 1. No evidence of fracture or dislocation. Total knee arthroplasty is grossly unremarkable. 2. Diffuse vascular calcifications seen. Electronically Signed   By: Garald Balding M.D.   On: 09/11/2015 06:01   Dg Hip Unilat  With Pelvis 2-3 Views Right  09/11/2015  CLINICAL DATA:  Status post fall, with right hip pain. Initial encounter. EXAM: DG HIP (WITH OR WITHOUT PELVIS) 2-3V RIGHT COMPARISON:  None. FINDINGS: There is a mildly displaced right femoral intertrochanteric fracture, with mild medial angulation. No additional fractures are seen. The right femoral head remains seated at the acetabulum. Axial joint space narrowing is noted at the left hip. The sacroiliac joints are grossly unremarkable. Mild degenerative change is noted at the lower lumbar spine. Diffuse vascular calcifications are seen. Mild chronic deformity is noted at the right superior and inferior pubic rami. IMPRESSION: 1. Mildly displaced right femoral intertrochanteric fracture, with mild medial angulation. 2. Diffuse vascular calcifications seen. Electronically Signed   By: Garald Balding M.D.   On: 09/11/2015 05:55    Review of Systems  Unable to perform ROS: dementia   Blood pressure 126/68, pulse 71, temperature 97.8 F (36.6 C), temperature source Oral, resp. rate 17, SpO2 95 %. Physical Exam  Nursing note and  vitals reviewed. Constitutional:  He appears well-developed and well-nourished. No distress.  HENT:  Head: Normocephalic and atraumatic.  Mouth/Throat: No oropharyngeal exudate.  Eyes: EOM are normal. Pupils are equal, round, and reactive to light. No scleral icterus.  Neck: Normal range of motion. Neck supple. No JVD present.  Cardiovascular: Normal rate, regular rhythm, S1 normal and S2 normal.   Murmur heard.  Systolic murmur is present with a grade of 2/6  Pulses:      Radial pulses are 2+ on the right side, and 2+ on the left side.  Respiratory: Effort normal and breath sounds normal. He has no wheezes. He has no rales.  GI: Soft. He exhibits no distension. There is no tenderness.  Hyperactive bowel sounds  Musculoskeletal:  1+ LEE  Lymphadenopathy:    He has no cervical adenopathy.  Neurological: He is alert. He exhibits normal muscle tone.  Not oriented   Skin: Skin is warm.  Psychiatric:  The patient is very confused.     Assessment/Plan: Active Problems:   Closed right hip fracture (HCC)   Atrial fibrillation (HCC)   Hypertension   Pain, acute due to trauma   Fracture of right hip (HCC)   Pleural effusion  82 yo male with a history of HTN, CAD, heart disease, Afib-on coumadin, CVA, Anemia, single chamber PPM.  He apparently had a stent in the 1990's.  No records available.  He presented after a fall and Xray revealed a mildly displaced right femoral intertrochanteric fracture.   His EF is 50-55% normal wall motion, normal LV size, but echo suggests mod to sev AS.   Severe diffuse thickening and calcification.  planimetered AVA iscalculated at 1cm^2 consistent with moderate to severe AS.  aortic root is moderately dilated at the Sinuses ofValsalva. Aortic root dimension: 48 mm (ED).  Mild to moderate MR.  RV moderately dilated.  The RA is massively dilated. With severe pulmonary HTN(56mHg).  CXR, BNP and exam suggest mild to moderate volume overload. He sounds wet when  you listening to his breathing.   He has had one dose of IV lasix 476m  Kidney function looks good.  BP is controlled on lisinopril 20.   He certainly has indications that he will be moderate to high risk for surgery; severe pulm HTN, mod to massive dilation of the right heart, possible mod to severe AS(However, peak velocity and mean gradient are consistent with mild AS.) LV is normal in size with normal EF.   I do not think he is a candidate for a valve replacement if it is severe and would not want to subject him to R/L heart caths and/or TEE.  I recommend additional IV Lasix.  MD opinion to follow.    HATarri FullerPAMattoon/21/2017, 7:30 PM   The patient was seen, examined and discussed with Brittainy M. SiRosita FirePA-C and I agree with the above.    [age]o male with a history of HTN, CAD (stent to unknown vessel in 1990'), heart disease, Afib-on coumadin, CVA, Anemia, single chamber PPM who was admitted after he broke his right hip. His echocardiogram from today showed normal LOVEF, moderate to severe aortic stenosis and severe pulmonary hypertension. Physical exam shows rales at both lung bases, I would give another 40 mg of iv lasix. The patient is a poor historian and denies any symptoms of CP or SOB. However, his severe pulmonary HTN and moderate to severe stenosis make him a high risk for an intermediate risk surgery. However he needs the surgery, so  we would recommend to diurese and proceed, we will follow closely.  Dorothy Spark, MD 09/12/2015

## 2015-09-12 NOTE — Progress Notes (Signed)
[facility] PROGRESS NOTE  Timothy Joyce ZOX:[id number] DOB: [birth year] DOA: 09/11/2015 PCP: No primary care provider on file.  Brief Summary  Timothy Joyce is a 82 y.o. male with a history of atrial fibrillation, prior CVA on Coumadin brought by EMS after falling early this morning. He got up to the bathroom sustaining a mechanical fall hitting his right hip, knee and right side of his head.  Pain was worse with movement and palpation.   Patient has a history of bilateral knee replacements; hip x ray showed mildly displaced right femoral intertrochanteric fracture, with mild medial angulation. At the ED, he was afebrile, and his VS were stable.  CT head negative for acute intracranial abnormality.   EKG was remarkable for atrial fibrillation with ventricular paced beats, unchanged from prior studies.    Assessment/Plan  Closed Right Hip Fracture: hip x ray showed mildly displaced right femoral intertrochanteric fracture, with mild medial angulation. -  Patient is high risk for surgery secondary to pulmonary hypertension and aortic valve stenosis  -  Poor exercise tolerance, mostly in chair or bed, but able to walk about 30-yards without dyspnea or chest pain -  Cardiology consult PRIOR to surgery please  Chronic Atrial Fibrillation with ventricularly paced rhythm, CHADs2vasc = 6, (HTN, age, stroke, vascular disease) so should be on anticoagulation, reportedly on Coumadin.  -  INR 1.26 -  Anticoagulation on hold due to anticipated surgery   Severe pulmonary hypertension with moderately dilated RV and massively dilated right atrium and moderate TR.  PA peak pressure 88 mm Hg, with evidence of volume overload on exam, CXR, and ECHO -  D/C IVF -  Lasix  IV once  -  Daily weights and strict I/O -  Judicious diuresis  Probable moderate to severe aortic valve stenosis, mild to moderate MR -  Judicious diuresis  CAD s/p stent placement in 1990s per patient report, however, he was  confused and several attempts at finding records were unsuccessful.   -  Patient also unclear about when most recent cath or stress test was -  States he can walk 30-meters without dyspnea or chest pain -  Patient not on aspirin, statin, or beta blocker  Hypertension. BP 150/74. Elevated -  Continue lisinopril  Memory problems vs. Delirium from acute illness  Hx of CVA, likely due to a-fib and now on warfarin although INR subtherapeutic  Leukopenia, mild and relatively stable -  Repeat WBC in AM  Normocytic anemia, likely of chronic disease -  Iron studies, B12, folate, TSH -  TSH -  Occult stool -  Repeat hgb in AM  Thrombocytopenia, if platelets drop further, would stop heparin and send HIT panel  Diet:  Regular, then NPO at MN Access:  PIV IVF:  off Proph:  heparin  Code Status: full, although given age and comorbidities would recommend DNR Family Communication: patient alone Disposition Plan: pending cardiology assessment and surgery   Consultants:  Orthopedic surgery  Cardiology  Procedures:  CT head  XR   Antibiotics:  none   HPI/Subjective:  Patient slow to answer and unclear about his medical history.  Reports he does not get SOB or chest pains with exertion or rest, but his ambulation is limited due to pain in his left leg.  Able to walk about 30 meters at his facility to lunch and back, but the rest of the time he spends sitting or in bed.    Objective: Filed Vitals:   09/11/15 1315 09/11/15 2107 09/12/15 0503  09/12/15 1336  BP: 139/69 143/79 144/73 126/68  Pulse: 73 75 73 71  Temp: 97.8 F (36.6 C) 97.8 F (36.6 C) 98.2 F (36.8 C) 97.8 F (36.6 C)  TempSrc: Oral Oral Oral Oral  Resp: SpO2: 96% 98% 96% 95%    Intake/Output Summary (Last 24 hours) at 09/12/15 1839 Last data filed at 09/12/15 1553  Gross per 24 hour  Intake    940 ml  Output   1900 ml  Net   -960 ml   There were no vitals filed for this visit. There  is no height or weight on file to calculate BMI.  Exam:   General:  Adult male, slow to answer, No acute distress  HEENT:  NCAT, MMM  Cardiovascular:  RRR, 2+ pulses, warm extremities  Respiratory:  Wheezing, no focal rales or rhonchi, no increased WOB  Abdomen:   NABS, soft, NT/ND  MSK:   Normal tone and bulk, 1+ pitting bilateral LEE  Neuro:  Grossly moves all extremities  Data Reviewed: Basic Metabolic Panel:  Recent Labs Lab 09/11/15 0515 09/12/15 0725  NA 138 140  K 4.7 3.8  CL 106 107  CO2 24 23  GLUCOSE 97 89  BUN 17 18  CREATININE 0.77 0.85  CALCIUM 8.8* 8.7*   Liver Function Tests: No results for input(s): AST, ALT, ALKPHOS, BILITOT, PROT, ALBUMIN in the last 168 hours. No results for input(s): LIPASE, AMYLASE in the last 168 hours. No results for input(s): AMMONIA in the last 168 hours. CBC:  Recent Labs Lab 09/11/15 0515 09/12/15 0725  WBC 3.6* 3.9*  NEUTROABS 1.9  --   HGB 11.6* 12.2*  HCT 35.2* 37.3*  MCV 93.1 93.0  PLT 126* 97*    No results found for this or any previous visit (from the past 240 hour(s)).   Studies: Dg Chest 1 View  09/11/2015  CLINICAL DATA:  Status post fall, with concern for chest injury. Initial encounter. EXAM: CHEST 1 VIEW COMPARISON:  None. FINDINGS: The lungs are well-aerated. A small right pleural effusion is noted. The left costophrenic angle is incompletely imaged on this study. Vascular congestion is seen, with increased interstitial markings, concerning for mild pulmonary edema. No pneumothorax is seen. The cardiomediastinal silhouette is enlarged. A pacemaker is noted, with a single lead ending at the right ventricle. No acute osseous abnormalities are seen. IMPRESSION: Small right pleural effusion. Vascular congestion and cardiomegaly. Increased interstitial markings raise concern for mild pulmonary edema. Electronically Signed   By: Roanna Raider M.D.   On: 09/11/2015 06:06   Ct Head Wo Contrast  09/11/2015   CLINICAL DATA:  Status post fall, hitting back of head. Patient on Coumadin. Headache. Initial encounter. EXAM: CT HEAD WITHOUT CONTRAST TECHNIQUE: Contiguous axial images were obtained from the base of the skull through the vertex without intravenous contrast. COMPARISON:  None. FINDINGS: There is no evidence of acute infarction, mass lesion, or intra- or extra-axial hemorrhage on CT. Prominence of the ventricles and sulci reflects mild to moderate cortical volume loss. Diffuse periventricular and subcortical white matter change likely reflects small vessel ischemic microangiopathy. Cerebellar atrophy is noted. Mild chronic ischemic change is noted at the right basal ganglia. The brainstem and fourth ventricle are within normal limits. The cerebral hemispheres demonstrate grossly normal gray-white differentiation. No mass effect or midline shift is seen. There is no evidence of fracture; visualized osseous structures are unremarkable in appearance. The orbits are within normal limits. Mucoperiosteal thickening is noted  at the sphenoid sinus. The remaining paranasal sinuses and mastoid air cells are well-aerated. No significant soft tissue abnormalities are seen. IMPRESSION: 1. No evidence of traumatic intracranial injury or fracture. 2. Mild to moderate cortical volume and diffuse small vessel ischemic microangiopathy. 3. Mild chronic ischemic change at the right basal ganglia. 4. Mucoperiosteal thickening at the sphenoid sinus. Electronically Signed   By: Roanna Raider M.D.   On: 09/11/2015 07:07   Dg Knee Complete 4 Views Right  09/11/2015  CLINICAL DATA:  Status post fall, with right hip pain. Initial encounter. EXAM: RIGHT KNEE - COMPLETE 4+ VIEW COMPARISON:  None. FINDINGS: There is no evidence of fracture or dislocation. The patient's total knee arthroplasty is grossly unremarkable in appearance, without evidence of loosening. The joint spaces are grossly preserved. No significant degenerative change is  seen; the patellofemoral joint is grossly unremarkable in appearance. No significant joint effusion is seen. Diffuse vascular calcifications are seen. IMPRESSION: 1. No evidence of fracture or dislocation. Total knee arthroplasty is grossly unremarkable. 2. Diffuse vascular calcifications seen. Electronically Signed   By: Roanna Raider M.D.   On: 09/11/2015 06:01   Dg Hip Unilat  With Pelvis 2-3 Views Right  09/11/2015  CLINICAL DATA:  Status post fall, with right hip pain. Initial encounter. EXAM: DG HIP (WITH OR WITHOUT PELVIS) 2-3V RIGHT COMPARISON:  None. FINDINGS: There is a mildly displaced right femoral intertrochanteric fracture, with mild medial angulation. No additional fractures are seen. The right femoral head remains seated at the acetabulum. Axial joint space narrowing is noted at the left hip. The sacroiliac joints are grossly unremarkable. Mild degenerative change is noted at the lower lumbar spine. Diffuse vascular calcifications are seen. Mild chronic deformity is noted at the right superior and inferior pubic rami. IMPRESSION: 1. Mildly displaced right femoral intertrochanteric fracture, with mild medial angulation. 2. Diffuse vascular calcifications seen. Electronically Signed   By: Roanna Raider M.D.   On: 09/11/2015 05:55    Scheduled Meds: .  ceFAZolin (ANCEF) IV  2 g Intravenous Once  . heparin subcutaneous  5,000 Units Subcutaneous 3 times per day  . lisinopril  20 mg Oral Daily  . sertraline  50 mg Oral Daily  . sodium chloride flush  3 mL Intravenous Q12H  . timolol  1 drop Right Eye Daily   Continuous Infusions:   Active Problems:   Closed right hip fracture (HCC)   Atrial fibrillation (HCC)   Hypertension   Pain, acute due to trauma   Fracture of right hip (HCC)   Pleural effusion    Time spent: 30 min    Timothy Joyce, [facility]  [facility] Pager [phone]. If 7PM-7AM, please contact night-coverage at [email or web address], password [facility] 09/12/2015, 6:39 PM   LOS: 1 day

## 2015-09-12 NOTE — Progress Notes (Signed)
  Echocardiogram 2D Echocardiogram has been performed.  Timothy Joyce 09/12/2015, 10:11 AM

## 2015-09-12 NOTE — Progress Notes (Signed)
Attempted to obtain medical record from [facility] as requested per MD. Found that Facility had closed since 1974. This Clinical research associate spoke to pt's son [name], who is his POA. Informed cardiac workups not done at [facility] but at [facility] in  [place].  Dr. Jeannett Senior , [phone] nserted the Pacemaker, and Dr. Tennis Ship,  [phone]  was Patient;s cardiologist. Unit secretary to fax form requesting Medical Record from Facility.Marland Kitchen Marland Kitchen

## 2015-09-13 ENCOUNTER — Inpatient Hospital Stay (HOSPITAL_COMMUNITY): Payer: Medicare Other | Admitting: Anesthesiology

## 2015-09-13 ENCOUNTER — Inpatient Hospital Stay (HOSPITAL_COMMUNITY): Payer: Medicare Other

## 2015-09-13 ENCOUNTER — Encounter (HOSPITAL_COMMUNITY)
Admission: EM | Disposition: A | Discharge status: Skilled Nursing Facility | Payer: Self-pay | Source: Home / Self Care | Attending: Internal Medicine

## 2015-09-13 ENCOUNTER — Encounter (HOSPITAL_COMMUNITY): Payer: Self-pay | Admitting: General Practice

## 2015-09-13 DIAGNOSIS — S72001S Fracture of unspecified part of neck of right femur, sequela: Secondary | ICD-10-CM

## 2015-09-13 DIAGNOSIS — E876 Hypokalemia: Secondary | ICD-10-CM

## 2015-09-13 DIAGNOSIS — Z5181 Encounter for therapeutic drug level monitoring: Secondary | ICD-10-CM

## 2015-09-13 DIAGNOSIS — Z7901 Long term (current) use of anticoagulants: Secondary | ICD-10-CM

## 2015-09-13 DIAGNOSIS — I481 Persistent atrial fibrillation: Secondary | ICD-10-CM

## 2015-09-13 HISTORY — PX: INTRAMEDULLARY (IM) NAIL INTERTROCHANTERIC: SHX5875

## 2015-09-13 LAB — CBC
HEMATOCRIT: 36.5 % — AB (ref 39.0–52.0)
HEMOGLOBIN: 12 g/dL — AB (ref 13.0–17.0)
MCH: 30.4 pg (ref 26.0–34.0)
MCHC: 32.9 g/dL (ref 30.0–36.0)
MCV: 92.4 fL (ref 78.0–100.0)
Platelets: 98 10*3/uL — ABNORMAL LOW (ref 150–400)
RBC: 3.95 MIL/uL — AB (ref 4.22–5.81)
RDW: 14.9 % (ref 11.5–15.5)
WBC: 3.9 10*3/uL — AB (ref 4.0–10.5)

## 2015-09-13 LAB — ABO/RH: ABO/RH(D): A POS

## 2015-09-13 LAB — BASIC METABOLIC PANEL
Anion gap: 12 (ref 5–15)
BUN: 17 mg/dL (ref 6–20)
CHLORIDE: 102 mmol/L (ref 101–111)
CO2: 26 mmol/L (ref 22–32)
Calcium: 8.5 mg/dL — ABNORMAL LOW (ref 8.9–10.3)
Creatinine, Ser: 0.7 mg/dL (ref 0.61–1.24)
GFR calc Af Amer: >60 mL/min (ref >60)
GFR calc non Af Amer: >60 mL/min (ref >60)
Glucose, Bld: 106 mg/dL — ABNORMAL HIGH (ref 65–99)
POTASSIUM: 3.2 mmol/L — AB (ref 3.5–5.1)
SODIUM: 140 mmol/L (ref 135–145)

## 2015-09-13 LAB — TYPE AND SCREEN
ABO/RH(D): A POS
Antibody Screen: NEGATIVE

## 2015-09-13 LAB — SURGICAL PCR SCREEN
MRSA, PCR: NEGATIVE
STAPHYLOCOCCUS AUREUS: NEGATIVE

## 2015-09-13 SURGERY — FIXATION, FRACTURE, INTERTROCHANTERIC, WITH INTRAMEDULLARY ROD
Anesthesia: General | Site: Hip | Laterality: Right

## 2015-09-13 MED ORDER — CEFAZOLIN SODIUM-DEXTROSE 2-3 GM-% IV SOLR
INTRAVENOUS | Status: DC | PRN
  Administered 2015-09-13: 2 g via INTRAVENOUS

## 2015-09-13 MED ORDER — FENTANYL CITRATE (PF) 100 MCG/2ML IJ SOLN: 25.0000 ug | INTRAMUSCULAR | Status: DC | PRN

## 2015-09-13 MED ORDER — ETOMIDATE 2 MG/ML IV SOLN
INTRAVENOUS | Status: DC | PRN
  Administered 2015-09-13: 10 mg via INTRAVENOUS

## 2015-09-13 MED ORDER — PROPOFOL 10 MG/ML IV BOLUS
INTRAVENOUS | Status: AC
  Filled 2015-09-13: qty 20

## 2015-09-13 MED ORDER — ACETAMINOPHEN 325 MG PO TABS: 650.0000 mg | ORAL_TABLET | Freq: Four times a day (QID) | ORAL | Status: DC | PRN

## 2015-09-13 MED ORDER — ONDANSETRON HCL 4 MG/2ML IJ SOLN
INTRAMUSCULAR | Status: AC
  Filled 2015-09-13: qty 2

## 2015-09-13 MED ORDER — ENOXAPARIN SODIUM 40 MG/0.4ML ~~LOC~~ SOLN: 40.0000 mg | Freq: Every day | SUBCUTANEOUS | Status: DC

## 2015-09-13 MED ORDER — ALUM & MAG HYDROXIDE-SIMETH 200-200-20 MG/5ML PO SUSP: 30.0000 mL | ORAL | Status: DC | PRN

## 2015-09-13 MED ORDER — SUGAMMADEX SODIUM 200 MG/2ML IV SOLN
INTRAVENOUS | Status: AC
  Filled 2015-09-13: qty 2

## 2015-09-13 MED ORDER — PHENOL 1.4 % MT LIQD: 1.0000 | OROMUCOSAL | Status: DC | PRN

## 2015-09-13 MED ORDER — ACETAMINOPHEN 650 MG RE SUPP: 650.0000 mg | Freq: Four times a day (QID) | RECTAL | Status: DC | PRN

## 2015-09-13 MED ORDER — ALBUMIN HUMAN 5 % IV SOLN
INTRAVENOUS | Status: DC | PRN
  Administered 2015-09-13: 14:00:00 via INTRAVENOUS

## 2015-09-13 MED ORDER — DEXTROSE 5 % IV SOLN
500.0000 mg | Freq: Four times a day (QID) | INTRAVENOUS | Status: DC | PRN
  Filled 2015-09-13: qty 5

## 2015-09-13 MED ORDER — MORPHINE SULFATE (PF) 2 MG/ML IV SOLN: 0.5000 mg | INTRAVENOUS | Status: DC | PRN

## 2015-09-13 MED ORDER — ONDANSETRON HCL 4 MG/2ML IJ SOLN
4.0000 mg | Freq: Four times a day (QID) | INTRAMUSCULAR | Status: DC | PRN
Start: 2015-09-13 — End: 2015-09-15

## 2015-09-13 MED ORDER — SUCCINYLCHOLINE CHLORIDE 20 MG/ML IJ SOLN
INTRAMUSCULAR | Status: AC
  Filled 2015-09-13: qty 1

## 2015-09-13 MED ORDER — METOCLOPRAMIDE HCL 5 MG PO TABS: 5.0000 mg | ORAL_TABLET | Freq: Three times a day (TID) | ORAL | Status: DC | PRN

## 2015-09-13 MED ORDER — LIDOCAINE HCL (CARDIAC) 20 MG/ML IV SOLN
INTRAVENOUS | Status: AC
  Filled 2015-09-13: qty 5

## 2015-09-13 MED ORDER — METOCLOPRAMIDE HCL 5 MG/ML IJ SOLN: 5.0000 mg | Freq: Three times a day (TID) | INTRAMUSCULAR | Status: DC | PRN

## 2015-09-13 MED ORDER — SODIUM CHLORIDE 0.9 % IV SOLN
INTRAVENOUS | Status: DC
  Administered 2015-09-13: 19:00:00 via INTRAVENOUS

## 2015-09-13 MED ORDER — HYDROCODONE-ACETAMINOPHEN 7.5-325 MG PO TABS: 1.0000 | ORAL_TABLET | Freq: Four times a day (QID) | ORAL | Status: DC | PRN

## 2015-09-13 MED ORDER — PROPOFOL 10 MG/ML IV BOLUS
INTRAVENOUS | Status: DC | PRN
  Administered 2015-09-13: 100 mg via INTRAVENOUS

## 2015-09-13 MED ORDER — POTASSIUM CHLORIDE 10 MEQ/100ML IV SOLN
10.0000 meq | INTRAVENOUS | Status: AC
  Administered 2015-09-13 (×3): 10 meq via INTRAVENOUS
  Filled 2015-09-13 (×3): qty 100

## 2015-09-13 MED ORDER — WARFARIN SODIUM 7.5 MG PO TABS
7.5000 mg | ORAL_TABLET | Freq: Once | ORAL | Status: AC
  Administered 2015-09-13: 7.5 mg via ORAL
  Filled 2015-09-13: qty 1

## 2015-09-13 MED ORDER — FENTANYL CITRATE (PF) 100 MCG/2ML IJ SOLN
INTRAMUSCULAR | Status: DC | PRN
  Administered 2015-09-13: 100 ug via INTRAVENOUS
  Administered 2015-09-13: 50 ug via INTRAVENOUS

## 2015-09-13 MED ORDER — ROCURONIUM BROMIDE 100 MG/10ML IV SOLN
INTRAVENOUS | Status: DC | PRN
  Administered 2015-09-13: 20 mg via INTRAVENOUS
  Administered 2015-09-13: 30 mg via INTRAVENOUS

## 2015-09-13 MED ORDER — LIDOCAINE HCL (CARDIAC) 20 MG/ML IV SOLN
INTRAVENOUS | Status: DC | PRN
  Administered 2015-09-13: 50 mg via INTRAVENOUS

## 2015-09-13 MED ORDER — ENOXAPARIN SODIUM 40 MG/0.4ML ~~LOC~~ SOLN
40.0000 mg | SUBCUTANEOUS | Status: DC
  Administered 2015-09-14 – 2015-09-15 (×2): 40 mg via SUBCUTANEOUS
  Filled 2015-09-13 (×2): qty 0.4

## 2015-09-13 MED ORDER — PHENYLEPHRINE HCL 10 MG/ML IJ SOLN
10.0000 mg | INTRAVENOUS | Status: DC | PRN
  Administered 2015-09-13: 25 ug/min via INTRAVENOUS

## 2015-09-13 MED ORDER — CEFAZOLIN SODIUM-DEXTROSE 2-3 GM-% IV SOLR
2.0000 g | Freq: Four times a day (QID) | INTRAVENOUS | Status: AC
  Administered 2015-09-13 – 2015-09-14 (×2): 2 g via INTRAVENOUS
  Filled 2015-09-13 (×3): qty 50

## 2015-09-13 MED ORDER — ONDANSETRON HCL 4 MG/2ML IJ SOLN
INTRAMUSCULAR | Status: DC | PRN
  Administered 2015-09-13: 4 mg via INTRAVENOUS

## 2015-09-13 MED ORDER — FENTANYL CITRATE (PF) 250 MCG/5ML IJ SOLN
INTRAMUSCULAR | Status: AC
  Filled 2015-09-13: qty 5

## 2015-09-13 MED ORDER — METHOCARBAMOL 500 MG PO TABS
500.0000 mg | ORAL_TABLET | Freq: Four times a day (QID) | ORAL | Status: DC | PRN
  Administered 2015-09-14 – 2015-09-15 (×2): 500 mg via ORAL
  Filled 2015-09-13 (×2): qty 1

## 2015-09-13 MED ORDER — ALBUMIN HUMAN 5 % IV SOLN
INTRAVENOUS | Status: AC
  Filled 2015-09-13: qty 250

## 2015-09-13 MED ORDER — POTASSIUM CHLORIDE ER 10 MEQ PO TBCR
10.0000 meq | EXTENDED_RELEASE_TABLET | Freq: Every day | ORAL | Status: DC
  Administered 2015-09-13 – 2015-09-15 (×3): 10 meq via ORAL
  Filled 2015-09-13 (×6): qty 1

## 2015-09-13 MED ORDER — LACTATED RINGERS IV SOLN
INTRAVENOUS | Status: DC
  Administered 2015-09-13 (×2): via INTRAVENOUS

## 2015-09-13 MED ORDER — HYDROCODONE-ACETAMINOPHEN 5-325 MG PO TABS: 1.0000 | ORAL_TABLET | Freq: Four times a day (QID) | ORAL | Status: DC | PRN

## 2015-09-13 MED ORDER — OXYCODONE HCL 5 MG PO TABS: 5.0000 mg | ORAL_TABLET | ORAL | Status: DC | PRN

## 2015-09-13 MED ORDER — MENTHOL 3 MG MT LOZG: 1.0000 | LOZENGE | OROMUCOSAL | Status: DC | PRN

## 2015-09-13 MED ORDER — 0.9 % SODIUM CHLORIDE (POUR BTL) OPTIME
TOPICAL | Status: DC | PRN
  Administered 2015-09-13: 1000 mL

## 2015-09-13 MED ORDER — WARFARIN - PHARMACIST DOSING INPATIENT: Freq: Every day | Status: DC

## 2015-09-13 MED ORDER — ONDANSETRON HCL 4 MG PO TABS: 4.0000 mg | ORAL_TABLET | Freq: Four times a day (QID) | ORAL | Status: DC | PRN

## 2015-09-13 MED ORDER — ROCURONIUM BROMIDE 50 MG/5ML IV SOLN
INTRAVENOUS | Status: AC
  Filled 2015-09-13: qty 1

## 2015-09-13 MED ORDER — ONDANSETRON HCL 4 MG/2ML IJ SOLN: 4.0000 mg | Freq: Once | INTRAMUSCULAR | Status: DC | PRN

## 2015-09-13 MED ORDER — ALBUMIN HUMAN 5 % IV SOLN
12.5000 g | Freq: Once | INTRAVENOUS | Status: AC
  Administered 2015-09-13: 12.5 g via INTRAVENOUS
  Filled 2015-09-13: qty 250

## 2015-09-13 MED ORDER — SUGAMMADEX SODIUM 200 MG/2ML IV SOLN
INTRAVENOUS | Status: DC | PRN
  Administered 2015-09-13: 200 mg via INTRAVENOUS

## 2015-09-13 SURGICAL SUPPLY — 42 items
BLADE SURG 15 STRL LF DISP TIS (BLADE) ×1 IMPLANT
BLADE SURG 15 STRL SS (BLADE) ×2
BNDG COHESIVE 4X5 TAN NS LF (GAUZE/BANDAGES/DRESSINGS) ×3 IMPLANT
BNDG COHESIVE 6X5 TAN STRL LF (GAUZE/BANDAGES/DRESSINGS) IMPLANT
BNDG GAUZE ELAST 4 BULKY (GAUZE/BANDAGES/DRESSINGS) ×3 IMPLANT
COVER PERINEAL POST (MISCELLANEOUS) ×3 IMPLANT
COVER SURGICAL LIGHT HANDLE (MISCELLANEOUS) ×3 IMPLANT
DRAPE PROXIMA HALF (DRAPES) IMPLANT
DRAPE STERI IOBAN 125X83 (DRAPES) ×3 IMPLANT
DRSG MEPILEX BORDER 4X4 (GAUZE/BANDAGES/DRESSINGS) ×6 IMPLANT
DRSG MEPILEX BORDER 4X8 (GAUZE/BANDAGES/DRESSINGS) ×3 IMPLANT
DRSG PAD ABDOMINAL 8X10 ST (GAUZE/BANDAGES/DRESSINGS) ×6 IMPLANT
DURAPREP 26ML APPLICATOR (WOUND CARE) ×3 IMPLANT
ELECT CAUTERY BLADE 6.4 (BLADE) ×3 IMPLANT
ELECT REM PT RETURN 9FT ADLT (ELECTROSURGICAL) ×3
ELECTRODE REM PT RTRN 9FT ADLT (ELECTROSURGICAL) ×1 IMPLANT
FACESHIELD WRAPAROUND (MASK) ×3 IMPLANT
GAUZE XEROFORM 5X9 LF (GAUZE/BANDAGES/DRESSINGS) ×3 IMPLANT
GLOVE SKINSENSE NS SZ7.5 (GLOVE) ×4
GLOVE SKINSENSE STRL SZ7.5 (GLOVE) ×2 IMPLANT
GOWN STRL REIN XL XLG (GOWN DISPOSABLE) ×3 IMPLANT
GUIDE PIN 3.2MM (MISCELLANEOUS) ×2
GUIDE PIN ORTH 343X3.2XBRAD (MISCELLANEOUS) ×1 IMPLANT
KIT BASIN OR (CUSTOM PROCEDURE TRAY) ×3 IMPLANT
KIT ROOM TURNOVER OR (KITS) ×3 IMPLANT
LINER BOOT UNIVERSAL DISP (MISCELLANEOUS) ×3 IMPLANT
MANIFOLD NEPTUNE II (INSTRUMENTS) ×3 IMPLANT
NAIL TRIGEN INTERTAN RT 10X40 (Nail) ×3 IMPLANT
NS IRRIG 1000ML POUR BTL (IV SOLUTION) ×3 IMPLANT
PACK GENERAL/GYN (CUSTOM PROCEDURE TRAY) ×3 IMPLANT
PAD ARMBOARD 7.5X6 YLW CONV (MISCELLANEOUS) ×6 IMPLANT
PAD CAST 4YDX4 CTTN HI CHSV (CAST SUPPLIES) ×2 IMPLANT
PADDING CAST COTTON 4X4 STRL (CAST SUPPLIES) ×4
SCREW LAG COMPR KIT 115/110 (Screw) ×3 IMPLANT
STAPLER VISISTAT 35W (STAPLE) ×3 IMPLANT
SUT VIC AB 0 CT1 27 (SUTURE) ×8
SUT VIC AB 0 CT1 27XBRD ANBCTR (SUTURE) ×4 IMPLANT
SUT VIC AB 2-0 CT1 27 (SUTURE) ×4
SUT VIC AB 2-0 CT1 TAPERPNT 27 (SUTURE) ×2 IMPLANT
TOWEL OR 17X24 6PK STRL BLUE (TOWEL DISPOSABLE) ×3 IMPLANT
TOWEL OR 17X26 10 PK STRL BLUE (TOWEL DISPOSABLE) ×3 IMPLANT
WATER STERILE IRR 1000ML POUR (IV SOLUTION) ×3 IMPLANT

## 2015-09-13 NOTE — Progress Notes (Signed)
[facility] PROGRESS NOTE  Timothy Joyce WUJ:[id number] DOB: 10/07/1925 DOA: 09/11/2015 PCP: Timothy Joyce.  Brief Summary  Timothy Joyce is a 82 y.o. male with a history of atrial fibrillation, prior CVA on Coumadin brought by EMS after falling early this morning. He got up to the bathroom sustaining a mechanical fall hitting his right hip, knee and right side of his head.  Pain was worse with movement and palpation.   Patient has a history of bilateral knee replacements; hip x ray showed mildly displaced right femoral intertrochanteric fracture, with mild medial angulation. At the ED, he was afebrile, and his VS were stable.  CT head negative for acute intracranial abnormality.   EKG was remarkable for atrial fibrillation with ventricular paced beats, unchanged from prior studies.    Assessment/Plan  Closed Right Hip Fracture: hip x ray showed mildly displaced right femoral intertrochanteric fracture, with mild medial angulation. -  Patient is high risk for surgery secondary to pulmonary hypertension and aortic valve stenosis  -  Poor exercise tolerance, mostly in chair or bed, but able to walk about 30-yards without dyspnea or chest pain -  Cardiology input appreciated: High risk for an intermediate risk surgery - Patient was seen this morning prior to surgery but has since then undergone surgery/intramedullary implant.  - Management per orthopedics.   Chronic Atrial Fibrillation with ventricularly paced rhythm, CHADs2vasc = 6, (HTN, age, stroke, vascular disease) so should be on anticoagulation, reportedly on Coumadin.  -  INR 1.26 -  Anticoagulation on hold due to anticipated surgery  - Resume anticoagulation when cleared by orthopedics.  Severe pulmonary hypertension with moderately dilated RV and massively dilated right atrium and moderate TR.  PA peak pressure 88 mm Hg, with evidence of volume overload on exam, CXR, and ECHO -  D/C IVF -  Lasix  IV once  -   Daily weights and strict I/O -  Judicious diuresis - Clinically appeared euvolemic this morning.  Probable moderate to severe aortic valve stenosis, mild to moderate MR -  Judicious diuresis  CAD s/p stent placement in 1990s per patient report, however, he was confused and several attempts at finding records were unsuccessful.   -  Patient also unclear about when most recent cath or stress test was -  States he can walk 30-meters without dyspnea or chest pain -  Patient not on aspirin, statin, or beta blocker  Hypertension. BP 150/74. Elevated -  Continue lisinopril  Memory problems vs. Delirium from acute illness  Hx of CVA, likely due to a-fib and now on warfarin although INR subtherapeutic  Leukopenia, mild and relatively stable -  Follow CBCs.  Normocytic anemia, likely of chronic disease -  Iron studies, B12, folate, TSH -  TSH -  Occult stool -  Repeat hgb in AM  Thrombocytopenia, stable. Follow CBCs.  Diet:  Regular, then NPO at MN Access:  PIV IVF:  off Proph:  heparin  DVT prophylaxis: Defer to orthopedics. Code Status: full, although given age and comorbidities would recommend DNR Family Communication: Discussed with patient's spouse and son at bedside prior to surgery this morning on 2/22. Disposition Plan: Likely SNF when medically stable.   Consultants:  Orthopedic surgery  Cardiology  Procedures:  CT head  XR   Right hip implant on 2/22  Antibiotics:  none   HPI/Subjective:  Patient seen this morning prior to surgery. Pleasantly confused. Denied chest pain or dyspnea.  Objective: Filed Vitals:   09/13/15 1712 09/13/15  1715 09/13/15 1724 09/13/15 1734  BP: 106/62  100/52 120/70  Pulse: 75 76 75 80  Temp:    97.4 F (36.3 C)  TempSrc:      Resp: 20 26 25 23   Weight:      SpO2: 96% 95% 98% 96%    Intake/Output Summary (Last 24 hours) at 09/13/15 1754 Last data filed at 09/13/15 1445  Gross per 24 hour  Intake    940 ml   Output   1400 ml  Net   -460 ml   Filed Weights   09/13/15 0508  Weight: 100.7 kg (222 lb 0.1 oz)   There is Timothy height on Joyce to calculate BMI.  Exam:   General:  Adult male, slow to answer, Timothy acute distress  HEENT:  NCAT, MMM  Cardiovascular:  RRR, 2+ pulses, warm extremities  Respiratory:  Clear to auscultation, Timothy increased WOB  Abdomen:   NABS, soft, NT/ND  MSK:   Normal tone and bulk, 1+ pitting bilateral LEE  Neuro:  Grossly moves all extremities. Alert and oriented only to self.  Data Reviewed: Basic Metabolic Panel:  Recent Labs Lab 09/11/15 0515 09/12/15 0725 09/13/15 0759  NA 138 140 140  K 4.7 3.8 3.2*  CL 106 107 102  CO2 24 23 26   GLUCOSE 97 89 106*  BUN 17 18 17   CREATININE 0.77 0.85 0.70  CALCIUM 8.8* 8.7* 8.5*   Liver Function Tests: Timothy results for input(s): AST, ALT, ALKPHOS, BILITOT, PROT, ALBUMIN in the last 168 hours. Timothy results for input(s): LIPASE, AMYLASE in the last 168 hours. Timothy results for input(s): AMMONIA in the last 168 hours. CBC:  Recent Labs Lab 09/11/15 0515 09/12/15 0725 09/13/15 0759  WBC 3.6* 3.9* 3.9*  NEUTROABS 1.9  --   --   HGB 11.6* 12.2* 12.0*  HCT 35.2* 37.3* 36.5*  MCV 93.1 93.0 92.4  PLT 126* 97* 98*    Recent Results (from the past 240 hour(s))  Surgical pcr screen     Status: None   Collection Time: 09/13/15  8:57 AM  Result Value Ref Range Status   MRSA, PCR NEGATIVE NEGATIVE Final   Staphylococcus aureus NEGATIVE NEGATIVE Final    Comment:        The Xpert SA Assay (FDA approved for NASAL specimens in patients over 92 years of age), is one component of a comprehensive surveillance program.  Test performance has been validated by [facility] for patients greater than or equal to 53 year old. It is not intended to diagnose infection nor to guide or monitor treatment.      Studies: Dg Hip Operative Unilat With Pelvis Right  09/13/2015  CLINICAL DATA:  Right femoral fracture ORIF.  EXAM: OPERATIVE RIGHT HIP (WITH PELVIS IF PERFORMED) TWO VIEWS TECHNIQUE: Fluoroscopic spot image(s) were submitted for interpretation post-operatively. COMPARISON:  09/11/2015 FINDINGS: Three intraoperative spot fluoroscopic images of the right hip are provided, 1 frontal and 2 lateral projections. Intertrochanteric femur fracture has been reduced with apparent near anatomic alignment on these limited images. An antegrade intramedullary nail has been placed with 2 proximal screws traversing the fracture and terminating in the femoral head. IMPRESSION: Intraoperative images during ORIF of intertrochanteric right femur fracture. Electronically Signed   By: Sebastian Ache M.D.   On: 09/13/2015 14:53    Scheduled Meds: . albumin human      .  ceFAZolin (ANCEF) IV  2 g Intravenous Once  . [MAR Hold] lisinopril  20 mg Oral Daily  . [  MAR Hold] sertraline  50 mg Oral Daily  . [MAR Hold] sodium chloride flush  3 mL Intravenous Q12H  . [MAR Hold] timolol  1 drop Right Eye Daily   Continuous Infusions: . lactated ringers 10 mL/hr at 09/13/15 1242    Active Problems:   Closed right hip fracture (HCC)   Atrial fibrillation (HCC)   Hypertension   Pain, acute due to trauma   Fracture of right hip (HCC)   Pleural effusion   Anticoagulated on Coumadin    Time spent: 30 min  Rondalyn Belford, MD, FACP, FHM. [facility] Pager [phone]  If 7PM-7AM, please contact night-coverage [email or web address] Password [email or web address] 09/13/2015, 6:00 PM    LOS: 2 days

## 2015-09-13 NOTE — Anesthesia Procedure Notes (Signed)
Procedure Name: Intubation Date/Time: 09/13/2015 1:54 PM Performed by: Ferol Luz L Pre-anesthesia Checklist: Patient identified, Emergency Drugs available, Suction available, Patient being monitored and Timeout performed Patient Re-evaluated:Patient Re-evaluated prior to inductionOxygen Delivery Method: Circle system utilized Preoxygenation: Pre-oxygenation with 100% oxygen Intubation Type: IV induction and Cricoid Pressure applied Ventilation: Mask ventilation without difficulty Laryngoscope Size: Mac and 4 Grade View: Grade III Tube type: Oral Tube size: 8.0 mm Number of attempts: 2 Airway Equipment and Method: Stylet Placement Confirmation: ETT inserted through vocal cords under direct vision,  positive ETCO2 and breath sounds checked- equal and bilateral Secured at: 22 cm Tube secured with: Tape Dental Injury: Teeth and Oropharynx as per pre-operative assessment  Difficulty Due To: Difficult Airway- due to reduced neck mobility Comments: Pt coughing and swallowing with first DL attempted tp pass tube and resulted in esophageal intubation  Immediately withdrawn and pt masked sats maintained 2nd DL with additional meds ett passed successfully

## 2015-09-13 NOTE — Transfer of Care (Signed)
Immediate Anesthesia Transfer of Care Note  Patient: Timothy Joyce  Procedure(s) Performed: Procedure(s): INTRAMEDULLARY (IM) NAIL RIGHT HIP INTERTROCHANTERIC (Right)  Patient Location: PACU  Anesthesia Type:General  Level of Consciousness: awake, patient cooperative, confused and responds to stimulation  Airway & Oxygen Therapy: Patient Spontanous Breathing and Patient connected to face mask oxygen  Post-op Assessment: Report given to RN, Post -op Vital signs reviewed and stable and Patient moving all extremities  Post vital signs: Reviewed and stable  Last Vitals:  Filed Vitals:   09/13/15 0508 09/13/15 1217  BP: 125/65 137/79  Pulse: 73 74  Temp: 36.6 C 36.3 C  Resp: 16 18    Complications: No apparent anesthesia complications

## 2015-09-13 NOTE — Anesthesia Postprocedure Evaluation (Signed)
Anesthesia Post Note  Patient: Timothy Joyce  Procedure(s) Performed: Procedure(s) (LRB): INTRAMEDULLARY (IM) NAIL RIGHT HIP INTERTROCHANTERIC (Right)  Patient location during evaluation: PACU Anesthesia Type: General Level of consciousness: awake and alert and oriented Pain management: pain level controlled Vital Signs Assessment: post-procedure vital signs reviewed and stable Respiratory status: spontaneous breathing, nonlabored ventilation and respiratory function stable Cardiovascular status: blood pressure returned to baseline and stable Anesthetic complications: no    Last Vitals:  Filed Vitals:   09/13/15 1724 09/13/15 1734  BP: 100/52 120/70  Pulse: 75 80  Temp:  36.3 C  Resp: 25 23    Last Pain:  Filed Vitals:   09/13/15 1743  PainSc: 0-No pain                 Emillio Ngo A.

## 2015-09-13 NOTE — Op Note (Signed)
   Date of Surgery: 09/13/2015  INDICATIONS: Mr. Brooks is a 82 y.o.-year-old male who sustained a right hip fracture. The risks and benefits of the procedure discussed with the patient prior to the procedure and all questions were answered; consent was obtained.  PREOPERATIVE DIAGNOSIS: right hip fracture   POSTOPERATIVE DIAGNOSIS: Same   PROCEDURE: Treatment of intertrochanteric, pertrochanteric, subtrochanteric fracture with intramedullary implant. CPT [phone]   SURGEON: N. Glee Arvin, M.D.   ANESTHESIA: general   IV FLUIDS AND URINE: See anesthesia record   ESTIMATED BLOOD LOSS: 200 cc  IMPLANTS: [employer] InterTAN 10 x 40, 115/110  DRAINS: None.   COMPLICATIONS: None.   DESCRIPTION OF PROCEDURE: The patient was brought to the operating room and placed supine on the operating table. The patient's leg had been signed prior to the procedure. The patient had the anesthesia placed by the anesthesiologist. The prep verification and incision time-outs were performed to confirm that this was the correct patient, site, side and location. The patient had an SCD on the opposite lower extremity. The patient did receive antibiotics prior to the incision and was re-dosed during the procedure as needed at indicated intervals. The patient was positioned on the fracture table with the table in traction and internal rotation to reduce the hip. The well leg was placed in a scissor position and all bony prominences were well-padded. The patient had the lower extremity prepped and draped in the standard surgical fashion. The incision was made 4 finger breadths superior to the greater trochanter. A guide pin was inserted into the tip of the greater trochanter under fluoroscopic guidance. An opening reamer was used to gain access to the femoral canal. The nail length was measured and inserted down the femoral canal to its proper depth. The appropriate version of insertion for the lag screw was found under  fluoroscopy. A pin was inserted up the femoral neck through the jig. Then, a second antirotation pin was inserted inferior to the first pin. The length of the lag screw was then measured. The lag screw was inserted as near to center-center in the head as possible. The antirotation pin was then taken out and an interdigitating compression screw was placed in its place. The leg was taken out of traction, then the interdigitating compression screw was used to compress across the fracture. Compression was visualized on serial xrays. The wound was copiously irrigated with saline and the subcutaneous layer closed with 2.0 vicryl and the skin was reapproximated with staples. The wounds were cleaned and dried a final time and a sterile dressing was placed. The hip was taken through a range of motion at the end of the case under fluoroscopic imaging to visualize the approach-withdraw phenomenon and confirm implant length in the head. The patient was then awakened from anesthesia and taken to the recovery room in stable condition. All counts were correct at the end of the case.   POSTOPERATIVE PLAN: The patient will be weight bearing as tolerated and will return in 2 weeks for staple removal and the patient will receive DVT prophylaxis based on other medications, activity level, and risk ratio of bleeding to thrombosis.   Mayra Reel, MD [facility] [phone] 2:31 PM

## 2015-09-13 NOTE — Progress Notes (Addendum)
ANTICOAGULATION CONSULT NOTE - Initial Consult  Pharmacy Consult for Lovenox and Coumadin Indication: atrial fibrillation and history of stroke  No Known Allergies  Patient Measurements: Weight: 222 lb 0.1 oz (100.7 kg) Heparin Dosing Weight:   Vital Signs: Temp: 97.5 F (36.4 C) (02/22 1838) Temp Source: Oral (02/22 1838) BP: 129/73 mmHg (02/22 1838) Pulse Rate: 88 (02/22 1800)  Labs:  Recent Labs  09/11/15 0515 09/12/15 0725 09/13/15 0759  HGB 11.6* 12.2* 12.0*  HCT 35.2* 37.3* 36.5*  PLT 126* 97* 98*  LABPROT 16.0* 16.9*  --   INR 1.26 1.36  --   CREATININE 0.77 0.85 0.70    CrCl cannot be calculated (Unknown ideal weight.).   Medical History: Past Medical History  Diagnosis Date  . Anemia   . Hypertension   . Heart disease   . Hearing loss   . Irregular heartbeat   . Stroke (HCC)   . Fracture of right hip (HCC)   . Presence of permanent cardiac pacemaker     [employer]; placed October 2014    Medications:  Scheduled:  . albumin human      .  ceFAZolin (ANCEF) IV  2 g Intravenous Q6H  . [START ON 09/14/2015] enoxaparin (LOVENOX) injection  40 mg Subcutaneous Q24H  . lisinopril  20 mg Oral Daily  . potassium chloride  10 mEq Oral Daily  . potassium chloride  10 mEq Intravenous Q1 Hr x 3  . sertraline  50 mg Oral Daily  . sodium chloride flush  3 mL Intravenous Q12H  . timolol  1 drop Right Eye Daily    Assessment: 82yo male with hx AFib and stroke, admitted 2/20 s/p fall in which he hit his hip/knee and head.  Now s/p IM nail to R-hip and to resume Coumadin with Lovenox bridge.  INR was subtherapeutic on admission; Vit K was ordered but not given.  I paged Dr Roda Shutters to clarify whether he wanted full dose Lovenox or prophylaxis.    Home dose of Coumadin is  daily (  tab +  tab), with stated last dose as 2/19, but question his compliance given admission INR.    2/20  CT (-)hemorrhage  2/20  INR 1.26 and pltc 126 2/22 Hg 12 and pltc 98; Cr 0.7,  CrCl ~45-50  Goal of Therapy:  INR 2-3 Monitor platelets by anticoagulation protocol: Yes   Plan:  1-  Lovenox  SQ q24 until INR > 2 2-  Coumadin 7.5mg  3-  Daily INR and q48 CBC 4-  Watch pltc closely, and for s/s of bleeding  Marisue Humble, PharmD Clinical Pharmacist [facility]

## 2015-09-13 NOTE — Anesthesia Preprocedure Evaluation (Addendum)
Anesthesia Evaluation  Patient identified by MRN, date of birth, ID band Patient awake    Reviewed: Allergy & Precautions, NPO status , Patient's Chart, lab work & pertinent test results  History of Anesthesia Complications Negative for: history of anesthetic complications  Airway Mallampati: II  TM Distance: >3 FB Neck ROM: Full    Dental  (+) Dental Advisory Given, Poor Dentition   Pulmonary neg pulmonary ROS, former smoker,    Pulmonary exam normal breath sounds clear to auscultation       Cardiovascular hypertension, Pt. on medications + CAD and +CHF  Normal cardiovascular exam+ dysrhythmias Atrial Fibrillation + pacemaker  Rhythm:Regular Rate:Normal  Echo: EF of 50-55%, normal wall motion, normal LV size. mild AS however, visually the AV appears moderate to severely stenotic. Severe diffuse thickening and calcification. planimetered AVA iscalculated at 1cm2 consistent with moderate to severe AS. aortic root is moderately dilated at the Sinuses ofValsalva. Aortic root dimension: 48 mm (ED). Mild to moderate MR. RV moderately dilated. The RA is massively dilated. Severe pulmonary HTN  history of HTN, CAD (stent to unknown vessel in 1990'), heart disease, Afib-on coumadin, CVA, Anemia, single chamber PPM who was admitted after he broke his right hip. His echocardiogram from today showed normal LOVEF, moderate to severe aortic stenosis and severe pulmonary hypertension.   Neuro/Psych CVA negative psych ROS   GI/Hepatic negative GI ROS, Neg liver ROS,   Endo/Other  negative endocrine ROS  Renal/GU negative Renal ROS     Musculoskeletal negative musculoskeletal ROS (+)   Abdominal   Peds  Hematology  (+) Blood dyscrasia, anemia ,   Anesthesia Other Findings Day of surgery medications reviewed with the patient.  Reproductive/Obstetrics                         Anesthesia  Physical Anesthesia Plan  ASA: IV  Anesthesia Plan: General   Post-op Pain Management:    Induction: Intravenous  Airway Management Planned: Oral ETT  Additional Equipment: Arterial line  Intra-op Plan:   Post-operative Plan: Possible Post-op intubation/ventilation  Informed Consent: I have reviewed the patients History and Physical, chart, labs and discussed the procedure including the risks, benefits and alternatives for the proposed anesthesia with the patient or authorized representative who has indicated his/her understanding and acceptance.   Dental advisory given  Plan Discussed with: CRNA  Anesthesia Plan Comments: (Risks/benefits of general anesthesia discussed with patient including risk of damage to teeth, lips, gum, and tongue, nausea/vomiting, allergic reactions to medications, and the possibility of heart attack, stroke and death.  All patient questions answered.  Patient wishes to proceed.)       Anesthesia Quick Evaluation

## 2015-09-14 ENCOUNTER — Encounter (HOSPITAL_COMMUNITY): Payer: Self-pay | Admitting: Orthopaedic Surgery

## 2015-09-14 DIAGNOSIS — Z950 Presence of cardiac pacemaker: Secondary | ICD-10-CM | POA: Diagnosis present

## 2015-09-14 DIAGNOSIS — D62 Acute posthemorrhagic anemia: Secondary | ICD-10-CM

## 2015-09-14 DIAGNOSIS — I480 Paroxysmal atrial fibrillation: Secondary | ICD-10-CM

## 2015-09-14 DIAGNOSIS — J90 Pleural effusion, not elsewhere classified: Secondary | ICD-10-CM

## 2015-09-14 DIAGNOSIS — I350 Nonrheumatic aortic (valve) stenosis: Secondary | ICD-10-CM

## 2015-09-14 DIAGNOSIS — I10 Essential (primary) hypertension: Secondary | ICD-10-CM

## 2015-09-14 LAB — CBC
HCT: 31.9 % — ABNORMAL LOW (ref 39.0–52.0)
HEMOGLOBIN: 10.4 g/dL — AB (ref 13.0–17.0)
MCH: 29.7 pg (ref 26.0–34.0)
MCHC: 32.6 g/dL (ref 30.0–36.0)
MCV: 91.1 fL (ref 78.0–100.0)
PLATELETS: 103 10*3/uL — AB (ref 150–400)
RBC: 3.5 MIL/uL — AB (ref 4.22–5.81)
RDW: 15 % (ref 11.5–15.5)
WBC: 4.5 10*3/uL (ref 4.0–10.5)

## 2015-09-14 LAB — BASIC METABOLIC PANEL
Anion gap: 11 (ref 5–15)
BUN: 21 mg/dL — AB (ref 6–20)
CO2: 25 mmol/L (ref 22–32)
CREATININE: 0.82 mg/dL (ref 0.61–1.24)
Calcium: 8.2 mg/dL — ABNORMAL LOW (ref 8.9–10.3)
Chloride: 105 mmol/L (ref 101–111)
GFR calc Af Amer: >60 mL/min (ref >60)
GLUCOSE: 114 mg/dL — AB (ref 65–99)
POTASSIUM: 3.9 mmol/L (ref 3.5–5.1)
SODIUM: 141 mmol/L (ref 135–145)

## 2015-09-14 LAB — PROTIME-INR
INR: 1.32 (ref 0.00–1.49)
Prothrombin Time: 16.5 seconds — ABNORMAL HIGH (ref 11.6–15.2)

## 2015-09-14 MED ORDER — WARFARIN SODIUM 7.5 MG PO TABS
7.5000 mg | ORAL_TABLET | Freq: Once | ORAL | Status: DC
  Filled 2015-09-14: qty 1

## 2015-09-14 MED ORDER — WARFARIN SODIUM 7.5 MG PO TABS
7.5000 mg | ORAL_TABLET | Freq: Once | ORAL | Status: AC
  Administered 2015-09-14: 7.5 mg via ORAL
  Filled 2015-09-14: qty 1

## 2015-09-14 MED ORDER — FUROSEMIDE 10 MG/ML IJ SOLN
40.0000 mg | Freq: Once | INTRAMUSCULAR | Status: AC
  Administered 2015-09-14: 40 mg via INTRAVENOUS
  Filled 2015-09-14: qty 4

## 2015-09-14 NOTE — Evaluation (Addendum)
Physical Therapy Evaluation Patient Details Name: Timothy Joyce MRN: [id number] DOB: 1925/10/07 Today's Date: 09/14/2015   History of Present Illness  82 y.o.-year-old male who sustained a right hip fracture due to a mechanical fall, nows/p IM nail on 09/13/15. PMH: hypertension, heart disease, pacemaker, CVA, bilateral TKA  Clinical Impression  Patient is s/p above surgery resulting in functional limitations due to the deficits listed below (see PT Problem List).  Patient will benefit from skilled PT to increase their independence and safety with mobility to allow discharge to the venue listed below.  At this time the patient is very confused and not following commands consistently, becoming agitated toward end of session. Recommending SNF when medically stable. PT to follow and progress mobility as tolerated.      Follow Up Recommendations SNF;Supervision/Assistance - 24 hour    Equipment Recommendations  Other (comment) (to be addressed at next level of care)    Recommendations for Other Services       Precautions / Restrictions Precautions Precautions: Fall Precaution Comments: has pacemaker Restrictions Weight Bearing Restrictions: Yes RLE Weight Bearing: Weight bearing as tolerated      Mobility  Bed Mobility Overal bed mobility: +2 for physical assistance;Needs Assistance Bed Mobility: Supine to Sit;Sit to Supine     Supine to sit: Total assist;+2 for physical assistance Sit to supine: Total assist;+2 for physical assistance   General bed mobility comments: Patient not following commands to assist with mobility  Transfers                 General transfer comment: not safe to attempt  Ambulation/Gait                Stairs            Wheelchair Mobility    Modified Rankin (Stroke Patients Only)       Balance Overall balance assessment: Needs assistance Sitting-balance support: Feet supported;Bilateral upper extremity supported Sitting  balance-Leahy Scale: Poor Sitting balance - Comments: initially with max assist for balance and decreasing to min guard                                     Pertinent Vitals/Pain Pain Assessment: No/denies pain    Home Living Family/patient expects to be discharged to:: Other (Comment)                 Additional Comments: spouse reports living at an independent living facility    Prior Function Level of Independence: Independent with assistive device(s)         Comments: per spouse report, used rollator for ambulation     Hand Dominance        Extremity/Trunk Assessment   Upper Extremity Assessment: Generalized weakness           Lower Extremity Assessment: Generalized weakness         Communication   Communication: HOH  Cognition Arousal/Alertness: Awake/alert Behavior During Therapy: Agitated Overall Cognitive Status: Impaired/Different from baseline Area of Impairment: Orientation Orientation Level: Disoriented to;Place;Situation                  General Comments General comments (skin integrity, edema, etc.): Patient confused throughout session, not following commands consistently. Becoming agitated toward end of session. Returned to bed for safety.     Exercises        Assessment/Plan    PT Assessment Patient needs continued PT services  PT Diagnosis Difficulty walking;Generalized weakness   PT Problem List Decreased strength;Decreased range of motion;Decreased activity tolerance;Decreased balance;Decreased mobility;Decreased safety awareness  PT Treatment Interventions DME instruction;Gait training;Functional mobility training;Therapeutic activities;Therapeutic exercise;Balance training;Patient/family education   PT Goals (Current goals can be found in the Care Plan section) Acute Rehab PT Goals Patient Stated Goal: unable to express PT Goal Formulation: With patient/family Time For Goal Achievement:  09/28/15 Potential to Achieve Goals: Fair    Frequency Min 3X/week   Barriers to discharge Decreased caregiver support      Co-evaluation               End of Session Equipment Utilized During Treatment: Oxygen Activity Tolerance: Treatment limited secondary to agitation Patient left: in bed;with call bell/phone within reach;with bed alarm set;with family/visitor present;with SCD's reapplied Nurse Communication: Mobility status;Precautions;Weight bearing status         Time: 1421-1445 PT Time Calculation (min) (ACUTE ONLY): 24 min   Charges:   PT Evaluation $PT Eval Moderate Complexity: 1 Procedure PT Treatments $Therapeutic Activity: 8-22 mins   PT G Codes:        Christiane Ha, PT, CSCS Pager [phone] Office [phone]  09/14/2015, 3:01 PM

## 2015-09-14 NOTE — Care Management Important Message (Signed)
Important Message  Patient Details  Name: Timothy Joyce MRN: [id number] Date of Birth: 1925-09-01   Medicare Important Message Given:  Yes    Oralia Rud Krystelle Prashad 09/14/2015, 4:36 PM

## 2015-09-14 NOTE — Progress Notes (Signed)
OT Cancellation Note  Patient Details Name: Timothy Joyce MRN: [id number] DOB: [birth year]   Cancelled Treatment:    Reason Eval/Treat Not Completed: Other (comment) Pt is Medicare/Medicaid and current D/C plan is SNF. No apparent immediate acute care OT needs, therefore will defer OT to SNF. If OT eval is needed please call Acute Rehab Dept. at [phone] or text page OT at [phone].  [facility] Pacey Willadsen, OTR/L  [phone] 09/14/2015 09/14/2015, 11:39 PM

## 2015-09-14 NOTE — Progress Notes (Signed)
   Subjective:  Patient is oriented to self only.  Objective:   VITALS:   Filed Vitals:   09/13/15 1800 09/13/15 1838 09/13/15 1940 09/14/15 0500  BP:  129/73 107/58 118/81  Pulse: 88  83 84  Temp: 97.4 F (36.3 C) 97.5 F (36.4 C) 97.4 F (36.3 C) 98.6 F (37 C)  TempSrc:  Oral Oral Oral  Resp: Weight:    103.1 kg (227 lb 4.7 oz)  SpO2: 95% 93% 92% 96%    Neurovascular intact Sensation intact distally Intact pulses distally Dorsiflexion/Plantar flexion intact Incision: dressing C/D/I and no drainage No cellulitis present Compartment soft   Lab Results  Component Value Date   WBC 4.5 09/14/2015   HGB 10.4* 09/14/2015   HCT 31.9* 09/14/2015   MCV 91.1 09/14/2015   PLT 103* 09/14/2015     Assessment/Plan:  1 Day Post-Op   - Expected postop acute blood loss anemia - will monitor for symptoms - Up with PT/OT - DVT ppx - SCDs, ambulation, lovenox - WBAT operative extremity - will need SNF  Cheral Almas 09/14/2015, 7:47 AM [phone]

## 2015-09-14 NOTE — Progress Notes (Addendum)
[facility] PROGRESS NOTE  Timothy Joyce ZOX:[id number] DOB: 1926-07-22 DOA: 09/11/2015 PCP: No primary care provider on file.  Brief Summary  Timothy Joyce is a 82 y.o. male with a history of atrial fibrillation, prior CVA on Coumadin brought by EMS after falling early this morning. He got up to the bathroom sustaining a mechanical fall hitting his right hip, knee and right side of his head.  Pain was worse with movement and palpation.   Patient has a history of bilateral knee replacements; hip x ray showed mildly displaced right femoral intertrochanteric fracture, with mild medial angulation. At the ED, he was afebrile, and his VS were stable.  CT head negative for acute intracranial abnormality.   EKG was remarkable for atrial fibrillation with ventricular paced beats, unchanged from prior studies.    Assessment/Plan  Closed Right Hip Fracture: hip x ray showed mildly displaced right femoral intertrochanteric fracture, with mild medial angulation. -  After risk stratification by cardiology preoperatively (assessed as high risk), patient underwent ORIF on 2/22. Management per orthopedics.   Chronic Atrial Fibrillation with ventricularly paced rhythm, CHADs2vasc = 6, (HTN, age, stroke, vascular disease) so should be on anticoagulation, reportedly on Coumadin.  -  INR 1.26 - Anticoagulation with Coumadin has been resumed postoperatively. Coumadin per pharmacy. Cardiology follow-up appreciated. Cardiology will have single-chamber Medtronic pacemaker interrogated today.  Severe pulmonary hypertension with moderately dilated RV and massively dilated right atrium and moderate TR.  PA peak pressure 88 mm Hg, with evidence of volume overload on exam, CXR, and ECHO -  Cardiology follow-up appreciated and weight up 5 pounds-giving additional IV Lasix today.  Probable moderate to severe aortic valve stenosis, mild to moderate MR -  Judicious diuresis  CAD s/p stent placement in 1990s per patient  report, however, he was confused and several attempts at finding records were unsuccessful.   -  Patient also unclear about when most recent cath or stress test was -  States he can walk 30-meters without dyspnea or chest pain -  Patient not on aspirin, statin, or beta blocker  Hypertension.  -  Continue lisinopril. Controlled  Memory problems vs. Delirium from acute illness  Hx of CVA, likely due to a-fib and now on warfarin although INR subtherapeutic  Leukopenia, mild and relatively stable -  Resolved today.  Normocytic anemia, likely of chronic disease/acute blood loss anemia -  Iron studies, B12, folate, TSH -  TSH -  Occult stool -  Drop in hemoglobin from 12-10.4 related to surgery. Follow CBC and transfuse if hemoglobin <7 g per DL.  Thrombocytopenia, stable. Follow CBCs periodically..  Diet:  Regular. Access:  PIV IVF:  off Proph:  heparin  DVT prophylaxis: Lovenox until INR therapeutic on Coumadin. Code Status: Full Family Communication: Discussed with patient's son [name] on 2/23. Updated care and answered questions. Disposition Plan: Likely SNF when medically stable.   Consultants:  Orthopedic surgery  Cardiology  Procedures:  CT head  XR   ORIF right hip on 2/22  Foley catheter-discontinued 2/23  Antibiotics:  none   HPI/Subjective: Pleasantly confused. Denies complaints. As per RN, no acute issues.  Objective: Filed Vitals:   09/13/15 1800 09/13/15 1838 09/13/15 1940 09/14/15 0500  BP:  129/73 107/58 118/81  Pulse: 88  83 84  Temp: 97.4 F (36.3 C) 97.5 F (36.4 C) 97.4 F (36.3 C) 98.6 F (37 C)  TempSrc:  Oral Oral Oral  Resp: 30  26 24   Weight:    103.1 kg (227 lb  4.7 oz)  SpO2: 95% 93% 92% 96%    Intake/Output Summary (Last 24 hours) at 09/14/15 1446 Last data filed at 09/14/15 0950  Gross per 24 hour  Intake    740 ml  Output      0 ml  Net    740 ml   Filed Weights   09/13/15 0508 09/14/15 0500  Weight: 100.7 kg (222  lb 0.1 oz) 103.1 kg (227 lb 4.7 oz)   There is no height on file to calculate BMI.  Exam:   General:  Adult male, slow to answer, No acute distress  HEENT:  NCAT, MMM  Cardiovascular:  RRR, 2+ pulses, warm extremities. Telemetry: A. fib with controlled ventricular rate and intermittent V paced.  Respiratory:  Clear to auscultation, no increased WOB  Abdomen:   NABS, soft, NT/ND  MSK:   Normal tone and bulk, 1+ pitting bilateral LEE. Right hip postop dressing clean and dry.  Neuro:  Grossly moves all extremities. Alert and oriented only to self.  Data Reviewed: Basic Metabolic Panel:  Recent Labs Lab 09/11/15 0515 09/12/15 0725 09/13/15 0759 09/14/15 0418  NA 138 140 140 141  K 4.7 3.8 3.2* 3.9  CL 106 107 102 105  CO2 GLUCOSE 97 89 106* 114*  BUN 21*  CREATININE 0.77 0.85 0.70 0.82  CALCIUM 8.8* 8.7* 8.5* 8.2*   Liver Function Tests: No results for input(s): AST, ALT, ALKPHOS, BILITOT, PROT, ALBUMIN in the last 168 hours. No results for input(s): LIPASE, AMYLASE in the last 168 hours. No results for input(s): AMMONIA in the last 168 hours. CBC:  Recent Labs Lab 09/11/15 0515 09/12/15 0725 09/13/15 0759 09/14/15 0418  WBC 3.6* 3.9* 3.9* 4.5  NEUTROABS 1.9  --   --   --   HGB 11.6* 12.2* 12.0* 10.4*  HCT 35.2* 37.3* 36.5* 31.9*  MCV 93.1 93.0 92.4 91.1  PLT 126* 97* 98* 103*    Recent Results (from the past 240 hour(s))  Surgical pcr screen     Status: None   Collection Time: 09/13/15  8:57 AM  Result Value Ref Range Status   MRSA, PCR NEGATIVE NEGATIVE Final   Staphylococcus aureus NEGATIVE NEGATIVE Final    Comment:        The Xpert SA Assay (FDA approved for NASAL specimens in patients over 33 years of age), is one component of a comprehensive surveillance program.  Test performance has been validated by [facility] for patients greater than or equal to 35 year old. It is not intended to diagnose infection nor to guide  or monitor treatment.      Studies: Dg Hip Operative Unilat With Pelvis Right  09/13/2015  CLINICAL DATA:  Right femoral fracture ORIF. EXAM: OPERATIVE RIGHT HIP (WITH PELVIS IF PERFORMED) TWO VIEWS TECHNIQUE: Fluoroscopic spot image(s) were submitted for interpretation post-operatively. COMPARISON:  09/11/2015 FINDINGS: Three intraoperative spot fluoroscopic images of the right hip are provided, 1 frontal and 2 lateral projections. Intertrochanteric femur fracture has been reduced with apparent near anatomic alignment on these limited images. An antegrade intramedullary nail has been placed with 2 proximal screws traversing the fracture and terminating in the femoral head. IMPRESSION: Intraoperative images during ORIF of intertrochanteric right femur fracture. Electronically Signed   By: Sebastian Ache M.D.   On: 09/13/2015 14:53    Scheduled Meds: . enoxaparin (LOVENOX) injection  40 mg Subcutaneous Q24H  . lisinopril  20 mg Oral Daily  . potassium  chloride  10 mEq Oral Daily  . sertraline  50 mg Oral Daily  . sodium chloride flush  3 mL Intravenous Q12H  . timolol  1 drop Right Eye Daily  . warfarin  7.5 mg Oral ONCE-1800  . Warfarin - Pharmacist Dosing Inpatient   Does not apply q1800   Continuous Infusions: . sodium chloride 125 mL/hr at 09/13/15 1837  . lactated ringers 10 mL/hr at 09/13/15 1242    Principal Problem:   Closed right hip fracture (HCC) Active Problems:   Atrial fibrillation (HCC)   Hypertension   Pain, acute due to trauma   Fracture of right hip (HCC)   Pleural effusion   Anticoagulated on Coumadin   Aortic stenosis   Pacemaker    Time spent: 30 min  Brody Kump, MD, FACP, FHM. [facility] Pager [phone]  If 7PM-7AM, please contact night-coverage [email or web address] Password [email or web address] 09/14/2015, 2:46 PM    LOS: 3 days

## 2015-09-14 NOTE — Progress Notes (Signed)
ANTICOAGULATION CONSULT NOTE - Initial Consult  Pharmacy Consult for Lovenox and Coumadin Indication: atrial fibrillation and history of stroke  No Known Allergies  Patient Measurements: Weight: 227 lb 4.7 oz (103.1 kg) Heparin Dosing Weight:   Vital Signs: Temp: 98.6 F (37 C) (02/23 0500) Temp Source: Oral (02/23 0500) BP: 118/81 mmHg (02/23 0500) Pulse Rate: 84 (02/23 0500)  Labs:  Recent Labs  09/12/15 0725 09/13/15 0759 09/14/15 0418  HGB 12.2* 12.0* 10.4*  HCT 37.3* 36.5* 31.9*  PLT 97* 98* 103*  LABPROT 16.9*  --  16.5*  INR 1.36  --  1.32  CREATININE 0.85 0.70 0.82    CrCl cannot be calculated (Unknown ideal weight.).   Medical History: Past Medical History  Diagnosis Date  . Anemia   . Hypertension   . Heart disease   . Hearing loss   . Irregular heartbeat   . Stroke (HCC)   . Fracture of right hip (HCC)   . Presence of permanent cardiac pacemaker     Medtronic; placed October 2014    Medications:  Scheduled:  .  ceFAZolin (ANCEF) IV  2 g Intravenous Q6H  . enoxaparin (LOVENOX) injection  40 mg Subcutaneous Q24H  . lisinopril  20 mg Oral Daily  . potassium chloride  10 mEq Oral Daily  . sertraline  50 mg Oral Daily  . sodium chloride flush  3 mL Intravenous Q12H  . timolol  1 drop Right Eye Daily  . Warfarin - Pharmacist Dosing Inpatient   Does not apply q1800    Assessment: 82yo male with hx AFib and stroke, admitted 2/20 s/p fall in which he hit his hip/knee and head.  Now s/p IM nail to R-hip and to resume Coumadin with Lovenox bridge.  INR was subtherapeutic on admission 1.26; Vit K was ordered but not given.  Dr Roda Shutters wants prophx LMWH until [facility] dose of Coumadin is  daily (  tab +  tab), with stated last dose as 2/19, but question his compliance given admission INR 1.26  2/20  CT (-)hemorrhage  2/20  INR 1.26 and pltc 126 2/22 Hg 12 and pltc 98; Cr 0.7, CrCl ~45-50 2/23: INR 1.32 today. Hgb down 12>10.4. Plts 103 around  baseline.  Goal of Therapy:  INR 2-3 Monitor platelets by anticoagulation protocol: Yes   Plan:  1-  Lovenox  SQ q24 until INR > 2 2-  Coumadin 7.5mg  x 1 again tonight. 3-  Daily INR and q48 CBC 4-  Watch pltc closely, and for s/s of bleeding   Deshondra Worst S. Merilynn Finland, PharmD, BCPS Clinical Staff Pharmacist Pager [phone]

## 2015-09-14 NOTE — Progress Notes (Signed)
DAILY PROGRESS NOTE  Subjective:  Uneventful hip surgery. He was being diuresed for diastolic CHF - pulmonary hypertension and moderate to severe AS with history of PPM (Medtronic Adapta single chamber - placed in 04/2013). Vitals stable, labs stable. Mild drop in H/H post-op. No chest pain.   Objective:  Temp:  [97.4 F (36.3 C)-98.6 F (37 C)] 98.6 F (37 C) (02/23 0500) Pulse Rate:  [70-88] 84 (02/23 0500) Resp:  [18-30] 24 (02/23 0500) BP: (84-148)/(52-83) 118/81 mmHg (02/23 0500) SpO2:  [91 %-100 %] 96 % (02/23 0500) Weight:  [227 lb 4.7 oz (103.1 kg)] 227 lb 4.7 oz (103.1 kg) (02/23 0500) Weight change: 5 lb 4.7 oz (2.4 kg)  Intake/Output from previous day: 02/22 0701 - 02/23 0700 In: 960 [I.V.:460; IV Piggyback:500] Out: 400 [Urine:150; Blood:250]  Intake/Output from this shift:    Medications: Current Facility-Administered Medications  Medication Dose Route Frequency Provider Last Rate Last Dose  . 0.9 %  sodium chloride infusion   Intravenous Continuous Leandrew Koyanagi, MD 125 mL/hr at 09/13/15 1837    . acetaminophen (TYLENOL) tablet 650 mg  650 mg Oral Q6H PRN Leandrew Koyanagi, MD       Or  . acetaminophen (TYLENOL) suppository 650 mg  650 mg Rectal Q6H PRN Naiping Ephriam Jenkins, MD      . alum & mag hydroxide-simeth (MAALOX/MYLANTA) 200-200-20 MG/5ML suspension 30 mL  30 mL Oral Q4H PRN Naiping Ephriam Jenkins, MD      . bisacodyl (DULCOLAX) EC tablet 5 mg  5 mg Oral Daily PRN Rondel Jumbo, PA-C      . ceFAZolin (ANCEF) IVPB 2 g/50 mL premix  2 g Intravenous Q6H Naiping Ephriam Jenkins, MD   2 g at 09/13/15 2352  . enoxaparin (LOVENOX) injection 40 mg  40 mg Subcutaneous Q24H Naiping Ephriam Jenkins, MD      . HYDROcodone-acetaminophen (NORCO/VICODIN) 5-325 MG per tablet 1-2 tablet  1-2 tablet Oral Q6H PRN Naiping Ephriam Jenkins, MD      . lactated ringers infusion   Intravenous Continuous Catalina Gravel, MD 10 mL/hr at 09/13/15 1242    . lisinopril (PRINIVIL,ZESTRIL) tablet 20 mg  20 mg Oral Daily Rondel Jumbo, PA-C   20 mg at 09/12/15 1038  . menthol-cetylpyridinium (CEPACOL) lozenge 3 mg  1 lozenge Oral PRN Naiping Ephriam Jenkins, MD       Or  . phenol (CHLORASEPTIC) mouth spray 1 spray  1 spray Mouth/Throat PRN Naiping Ephriam Jenkins, MD      . methocarbamol (ROBAXIN) tablet 500 mg  500 mg Oral Q6H PRN Naiping Ephriam Jenkins, MD       Or  . methocarbamol (ROBAXIN) 500 mg in dextrose 5 % 50 mL IVPB  500 mg Intravenous Q6H PRN Naiping Ephriam Jenkins, MD      . metoCLOPramide (REGLAN) tablet 5-10 mg  5-10 mg Oral Q8H PRN Naiping Ephriam Jenkins, MD       Or  . metoCLOPramide (REGLAN) injection 5-10 mg  5-10 mg Intravenous Q8H PRN Naiping Ephriam Jenkins, MD      . morphine 2 MG/ML injection 0.5 mg  0.5 mg Intravenous Q2H PRN Naiping Ephriam Jenkins, MD      . ondansetron [facility]) tablet 4 mg  4 mg Oral Q6H PRN Naiping Ephriam Jenkins, MD       Or  . ondansetron (ZOFRAN) injection 4 mg  4 mg Intravenous Q6H PRN Naiping Ephriam Jenkins, MD      .  oxyCODONE (Oxy IR/ROXICODONE) immediate release tablet 5-10 mg  5-10 mg Oral Q4H PRN Leandrew Koyanagi, MD      . potassium chloride (K-DUR) CR tablet 10 mEq  10 mEq Oral Daily Naiping Ephriam Jenkins, MD   10 mEq at 09/13/15 2103  . senna-docusate (Senokot-S) tablet 1 tablet  1 tablet Oral QHS PRN Rondel Jumbo, PA-C      . sertraline (ZOLOFT) tablet 50 mg  50 mg Oral Daily Rondel Jumbo, PA-C   50 mg at 09/12/15 1038  . sodium chloride flush (NS) 0.9 % injection 3 mL  3 mL Intravenous Q12H Rondel Jumbo, PA-C   3 mL at 09/13/15 2105  . timolol (TIMOPTIC) 0.5 % ophthalmic solution 1 drop  1 drop Right Eye Daily Rondel Jumbo, PA-C   1 drop at 09/12/15 1039  . Warfarin - Pharmacist Dosing Inpatient   Does not apply q1800 Jaquita Folds, [facility]        Physical Exam: General appearance: alert, appears stated age and no distress Neck: no carotid bruit and no JVD Lungs: diminished breath sounds bibasilar and rales bibasilar Heart: regular rate and rhythm, S1, S2 normal and systolic murmur: late systolic 3/6, crescendo at 2nd right intercostal space Abdomen:  soft, non-tender; bowel sounds normal; no masses,  no organomegaly Extremities: extremities normal, atraumatic, no cyanosis or edema Pulses: 2+ and symmetric Skin: Skin color, texture, turgor normal. No rashes or lesions Neurologic: Mental status: Alert, interacts, but confused, poor memory PSych: Pleasant  Lab Results: Results for orders placed or performed during the hospital encounter of 09/11/15 (from the past 48 hour(s))  Basic metabolic panel     Status: Abnormal   Collection Time: 09/13/15  7:59 AM  Result Value Ref Range   Sodium 140 135 - 145 mmol/L   Potassium 3.2 (L) 3.5 - 5.1 mmol/L   Chloride 102 101 - 111 mmol/L   CO2 26 22 - 32 mmol/L   Glucose, Bld 106 (H) 65 - 99 mg/dL   BUN 17 6 - 20 mg/dL   Creatinine, Ser 0.70 0.61 - 1.24 mg/dL   Calcium 8.5 (L) 8.9 - 10.3 mg/dL   GFR calc non Af Amer >60 >60 mL/min   GFR calc Af Amer >60 >60 mL/min    Comment: (NOTE) The eGFR has been calculated using the CKD EPI equation. This calculation has not been validated in all clinical situations. eGFR's persistently <60 mL/min signify possible Chronic Kidney Disease.    Anion gap 12 5 - 15  CBC     Status: Abnormal   Collection Time: 09/13/15  7:59 AM  Result Value Ref Range   WBC 3.9 (L) 4.0 - 10.5 K/uL    Comment: CONSISTENT WITH PREVIOUS RESULT   RBC 3.95 (L) 4.22 - 5.81 MIL/uL   Hemoglobin 12.0 (L) 13.0 - 17.0 g/dL    Comment: CONSISTENT WITH PREVIOUS RESULT   HCT 36.5 (L) 39.0 - 52.0 %   MCV 92.4 78.0 - 100.0 fL   MCH 30.4 26.0 - 34.0 pg   MCHC 32.9 30.0 - 36.0 g/dL   RDW 14.9 11.5 - 15.5 %   Platelets 98 (L) 150 - 400 K/uL    Comment: CONSISTENT WITH PREVIOUS RESULT  Type and screen [place]     Status: None   Collection Time: 09/13/15  7:59 AM  Result Value Ref Range   ABO/RH(D) A POS    Antibody Screen NEG    Sample Expiration 09/16/2015  ABO/Rh     Status: None   Collection Time: 09/13/15  7:59 AM  Result Value Ref Range   ABO/RH(D) A  POS   Surgical pcr screen     Status: None   Collection Time: 09/13/15  8:57 AM  Result Value Ref Range   MRSA, PCR NEGATIVE NEGATIVE   Staphylococcus aureus NEGATIVE NEGATIVE    Comment:        The Xpert SA Assay (FDA approved for NASAL specimens in patients over 48 years of age), is one component of a comprehensive surveillance program.  Test performance has been validated by [facility] for patients greater than or equal to 65 year old. It is not intended to diagnose infection nor to guide or monitor treatment.   CBC     Status: Abnormal   Collection Time: 09/14/15  4:18 AM  Result Value Ref Range   WBC 4.5 4.0 - 10.5 K/uL   RBC 3.50 (L) 4.22 - 5.81 MIL/uL   Hemoglobin 10.4 (L) 13.0 - 17.0 g/dL   HCT 31.9 (L) 39.0 - 52.0 %   MCV 91.1 78.0 - 100.0 fL   MCH 29.7 26.0 - 34.0 pg   MCHC 32.6 30.0 - 36.0 g/dL   RDW 15.0 11.5 - 15.5 %   Platelets 103 (L) 150 - 400 K/uL    Comment: CONSISTENT WITH PREVIOUS RESULT  Protime-INR     Status: Abnormal   Collection Time: 09/14/15  4:18 AM  Result Value Ref Range   Prothrombin Time 16.5 (H) 11.6 - 15.2 seconds   INR 1.32 0.00 - 7.25  Basic metabolic panel     Status: Abnormal   Collection Time: 09/14/15  4:18 AM  Result Value Ref Range   Sodium 141 135 - 145 mmol/L   Potassium 3.9 3.5 - 5.1 mmol/L    Comment: DELTA CHECK NOTED   Chloride 105 101 - 111 mmol/L   CO2 25 22 - 32 mmol/L   Glucose, Bld 114 (H) 65 - 99 mg/dL   BUN 21 (H) 6 - 20 mg/dL   Creatinine, Ser 0.82 0.61 - 1.24 mg/dL   Calcium 8.2 (L) 8.9 - 10.3 mg/dL   GFR calc non Af Amer >60 >60 mL/min   GFR calc Af Amer >60 >60 mL/min    Comment: (NOTE) The eGFR has been calculated using the CKD EPI equation. This calculation has not been validated in all clinical situations. eGFR's persistently <60 mL/min signify possible Chronic Kidney Disease.    Anion gap 11 5 - 15    Imaging: Dg Hip Operative Unilat With Pelvis Right  09/13/2015  CLINICAL DATA:  Right  femoral fracture ORIF. EXAM: OPERATIVE RIGHT HIP (WITH PELVIS IF PERFORMED) TWO VIEWS TECHNIQUE: Fluoroscopic spot image(s) were submitted for interpretation post-operatively. COMPARISON:  09/11/2015 FINDINGS: Three intraoperative spot fluoroscopic images of the right hip are provided, 1 frontal and 2 lateral projections. Intertrochanteric femur fracture has been reduced with apparent near anatomic alignment on these limited images. An antegrade intramedullary nail has been placed with 2 proximal screws traversing the fracture and terminating in the femoral head. IMPRESSION: Intraoperative images during ORIF of intertrochanteric right femur fracture. Electronically Signed   By: Logan Bores M.D.   On: 09/13/2015 14:53    Assessment:  Principal Problem:   Closed right hip fracture [facility]) Active Problems:   Atrial fibrillation ([place])   Hypertension   Pain, acute due to trauma   Fracture of right hip (HCC)   Pleural effusion   Anticoagulated  on Coumadin   Aortic stenosis   Pacemaker   Plan:  1. Doing well s/p hip surgery. Appears he has a single chamber medtronic pacemaker. Will have interrogated today. A-fib appears chronic. He is on warfarin. Cardiology care was at [facility], [place], [place] - Dr. Anastasio Auerbach. Records not available per [facility]where. Weight recorded up 5 lbs - give additional IV lasix today.  Time Spent Directly with Patient:  30 minutes  Length of Stay:  LOS: 3 days   Pixie Casino, MD, [facility] Attending Cardiologist [place] 09/14/2015, 9:38 AM

## 2015-09-14 NOTE — Discharge Instructions (Signed)
1. Change dressings as needed 2. May shower but keep incisions covered and dry 3. Take lovenox to prevent blood clots 4. Take stool softeners as needed 5. Take pain meds as needed       Information on my medicine - Coumadin   (Warfarin)  This medication education was reviewed with me or my healthcare representative as part of my discharge preparation.  The pharmacist that spoke with me during my hospital stay was:  Pasty Spillers, [facility]  Why was Coumadin prescribed for you? Coumadin was prescribed for you because you have a blood clot or a medical condition that can cause an increased risk of forming blood clots. Blood clots can cause serious health problems by blocking the flow of blood to the heart, lung, or brain. Coumadin can prevent harmful blood clots from forming. As a reminder your indication for Coumadin is:   Stroke Prevention Because Of Atrial Fibrillation  What test will check on my response to Coumadin? While on Coumadin (warfarin) you will need to have an INR test regularly to ensure that your dose is keeping you in the desired range. The INR (international normalized ratio) number is calculated from the result of the laboratory test called prothrombin time (PT).  If an INR APPOINTMENT HAS NOT ALREADY BEEN MADE FOR YOU please schedule an appointment to have this lab work done by your health care provider within 7 days. Your INR goal is usually a number between:  2 to 3 or your provider may give you a more narrow range like 2-2.5.  Ask your health care provider during an office visit what your goal INR is.  What  do you need to  know  About  COUMADIN? Take Coumadin (warfarin) exactly as prescribed by your healthcare provider about the same time each day.  DO NOT stop taking without talking to the doctor who prescribed the medication.  Stopping without other blood clot prevention medication to take the place of Coumadin may increase your risk of developing a  new clot or stroke.  Get refills before you run out.  What do you do if you miss a dose? If you miss a dose, take it as soon as you remember on the same day then continue your regularly scheduled regimen the next day.  Do not take two doses of Coumadin at the same time.  Important Safety Information A possible side effect of Coumadin (Warfarin) is an increased risk of bleeding. You should call your healthcare provider right away if you experience any of the following: ? Bleeding from an injury or your nose that does not stop. ? Unusual colored urine (red or dark brown) or unusual colored stools (red or black). ? Unusual bruising for unknown reasons. ? A serious fall or if you hit your head (even if there is no bleeding).  Some foods or medicines interact with Coumadin (warfarin) and might alter your response to warfarin. To help avoid this: ? Eat a balanced diet, maintaining a consistent amount of Vitamin K. ? Notify your provider about major diet changes you plan to make. ? Avoid alcohol or limit your intake to 1 drink for women and 2 drinks for men per day. (1 drink is 5 oz. wine, 12 oz. beer, or 1.5 oz. liquor.)  Make sure that ANY health care provider who prescribes medication for you knows that you are taking Coumadin (warfarin).  Also make sure the healthcare provider who is monitoring your Coumadin knows when you have started  a new medication including herbals and non-prescription products.  Coumadin (Warfarin)  Major Drug Interactions  Increased Warfarin Effect Decreased Warfarin Effect  Alcohol (large quantities) Antibiotics (esp. Septra/Bactrim, Flagyl, Cipro) Amiodarone (Cordarone) Aspirin (ASA) Cimetidine (Tagamet) Megestrol (Megace) NSAIDs (ibuprofen, naproxen, etc.) Piroxicam (Feldene) Propafenone (Rythmol SR) Propranolol (Inderal) Isoniazid (INH) Posaconazole (Noxafil) Barbiturates (Phenobarbital) Carbamazepine (Tegretol) Chlordiazepoxide (Librium) Cholestyramine  (Questran) Griseofulvin Oral Contraceptives Rifampin Sucralfate (Carafate) Vitamin K   Coumadin (Warfarin) Major Herbal Interactions  Increased Warfarin Effect Decreased Warfarin Effect  Garlic Ginseng Ginkgo biloba Coenzyme Q10 Green tea St. Johns wort    Coumadin (Warfarin) FOOD Interactions  Eat a consistent number of servings per week of foods HIGH in Vitamin K (1 serving =  cup)  Collards (cooked, or boiled & drained) Kale (cooked, or boiled & drained) Mustard greens (cooked, or boiled & drained) Parsley *serving size only =  cup Spinach (cooked, or boiled & drained) Swiss chard (cooked, or boiled & drained) Turnip greens (cooked, or boiled & drained)  Eat a consistent number of servings per week of foods MEDIUM-HIGH in Vitamin K (1 serving = 1 cup)  Asparagus (cooked, or boiled & drained) Broccoli (cooked, boiled & drained, or raw & chopped) Brussel sprouts (cooked, or boiled & drained) *serving size only =  cup Lettuce, raw (green leaf, endive, romaine) Spinach, raw Turnip greens, raw & chopped   These websites have more information on Coumadin (warfarin):  http://www.king-russell.com/; https://www.hines.net/;

## 2015-09-14 NOTE — Consult Note (Addendum)
WOC wound consult note Reason for Consult: Consult requested for right leg.  Pt had Una boot on, upon removal, there is no open wound, drainage, or edema.  This topical treatment is no longer indicated and leg can remain open to air. Please re-consult if further assistance is needed.  Thank-you,  Cammie Mcgee MSN, RN, CWOCN, [place], CNS [phone]

## 2015-09-15 DIAGNOSIS — S72001D Fracture of unspecified part of neck of right femur, subsequent encounter for closed fracture with routine healing: Secondary | ICD-10-CM

## 2015-09-15 LAB — BASIC METABOLIC PANEL
Anion gap: 7 (ref 5–15)
BUN: 28 mg/dL — ABNORMAL HIGH (ref 6–20)
BUN: 28 mg/dL — AB (ref 4–21)
CALCIUM: 8.2 mg/dL — AB (ref 8.9–10.3)
CO2: 26 mmol/L (ref 22–32)
CREATININE: 0.9 mg/dL (ref 0.61–1.24)
Chloride: 103 mmol/L (ref 101–111)
Creatinine: 0.9 mg/dL (ref 0.6–1.3)
GLUCOSE: 115 mg/dL
Glucose, Bld: 115 mg/dL — ABNORMAL HIGH (ref 65–99)
Potassium: 3.7 mmol/L (ref 3.5–5.1)
SODIUM: 136 mmol/L (ref 135–145)
Sodium: 136 mmol/L — AB (ref 137–147)

## 2015-09-15 LAB — CBC AND DIFFERENTIAL: WBC: 5 10^3/mL

## 2015-09-15 LAB — CBC
HCT: 31.4 % — ABNORMAL LOW (ref 39.0–52.0)
HEMOGLOBIN: 10.4 g/dL — AB (ref 13.0–17.0)
MCH: 30.5 pg (ref 26.0–34.0)
MCHC: 33.1 g/dL (ref 30.0–36.0)
MCV: 92.1 fL (ref 78.0–100.0)
Platelets: 99 10*3/uL — ABNORMAL LOW (ref 150–400)
RBC: 3.41 MIL/uL — ABNORMAL LOW (ref 4.22–5.81)
RDW: 15.2 % (ref 11.5–15.5)
WBC: 5 10*3/uL (ref 4.0–10.5)

## 2015-09-15 LAB — PROTIME-INR
INR: 1.67 — AB (ref 0.00–1.49)
PROTHROMBIN TIME: 19.7 s — AB (ref 11.6–15.2)

## 2015-09-15 MED ORDER — FUROSEMIDE 20 MG PO TABS
20.0000 mg | ORAL_TABLET | Freq: Every day | ORAL | Status: DC
  Administered 2015-09-15: 20 mg via ORAL
  Filled 2015-09-15: qty 1

## 2015-09-15 MED ORDER — WARFARIN SODIUM 7.5 MG PO TABS
7.5000 mg | ORAL_TABLET | Freq: Once | ORAL | Status: AC
  Administered 2015-09-15: 7.5 mg via ORAL
  Filled 2015-09-15: qty 1

## 2015-09-15 MED ORDER — FUROSEMIDE 20 MG PO TABS: 20.0000 mg | ORAL_TABLET | Freq: Every day | ORAL | Status: DC

## 2015-09-15 NOTE — Progress Notes (Signed)
Patient Name: Timothy Joyce Date of Encounter: 09/15/2015   SUBJECTIVE  The patient is confused. No chest pain or sob.   CURRENT MEDS . enoxaparin (LOVENOX) injection  40 mg Subcutaneous Q24H  . lisinopril  20 mg Oral Daily  . potassium chloride  10 mEq Oral Daily  . sertraline  50 mg Oral Daily  . sodium chloride flush  3 mL Intravenous Q12H  . timolol  1 drop Right Eye Daily  . warfarin  7.5 mg Oral ONCE-1800  . Warfarin - Pharmacist Dosing Inpatient   Does not apply q1800    OBJECTIVE  Filed Vitals:   09/14/15 0500 09/14/15 1500 09/14/15 2100 09/15/15 0500  BP: 118/81 106/49 107/55 111/66  Pulse: 84 70 78 81  Temp: 98.6 F (37 C) 98.7 F (37.1 C) 98.2 F (36.8 C) 97.9 F (36.6 C)  TempSrc: Oral Oral Oral Oral  Resp: Weight: 227 lb 4.7 oz (103.1 kg)   101 lb 3.2 oz (45.904 kg)  SpO2: 96% 98% 93% 94%    Intake/Output Summary (Last 24 hours) at 09/15/15 1207 Last data filed at 09/15/15 0522  Gross per 24 hour  Intake 1313.67 ml  Output    150 ml  Net 1163.67 ml   Filed Weights   09/13/15 0508 09/14/15 0500 09/15/15 0500  Weight: 222 lb 0.1 oz (100.7 kg) 227 lb 4.7 oz (103.1 kg) 101 lb 3.2 oz (45.904 kg)    PHYSICAL EXAM  General: Frail ill appearing confused male in NAD. Neuro: Only oriented to self. Moves all extremities spontaneously. Psych: Confused HEENT:  Normal  Neck: Supple without bruits or JVD. Lungs:  Resp regular and unlabored. Diminished breath sound bibasilar.  Heart: RRR no s3, s4. Systolic murmurs. Abdomen: Soft, non-tender, non-distended, BS + x 4.  Extremities: No clubbing, cyanosis. DP 1+ and equal bilaterally. Stocking in place with mild ankle edema.   Accessory Clinical Findings  CBC  Recent Labs  09/14/15 0418 09/15/15 0616  WBC 4.5 5.0  HGB 10.4* 10.4*  HCT 31.9* 31.4*  MCV 91.1 92.1  PLT 103* 99*   Basic Metabolic Panel  Recent Labs  09/14/15 0418 09/15/15 0616  NA 141 136  K 3.9 3.7  CL 105 103    CO2 25 26  GLUCOSE 114* 115*  BUN 21* 28*  CREATININE 0.82 0.90  CALCIUM 8.2* 8.2*    TELE  afib with intermittent V paced and PVCs  Radiology/Studies  Dg Chest 1 View  09/11/2015  CLINICAL DATA:  Status post fall, with concern for chest injury. Initial encounter. EXAM: CHEST 1 VIEW COMPARISON:  None. FINDINGS: The lungs are well-aerated. A small right pleural effusion is noted. The left costophrenic angle is incompletely imaged on this study. Vascular congestion is seen, with increased interstitial markings, concerning for mild pulmonary edema. No pneumothorax is seen. The cardiomediastinal silhouette is enlarged. A pacemaker is noted, with a single lead ending at the right ventricle. No acute osseous abnormalities are seen. IMPRESSION: Small right pleural effusion. Vascular congestion and cardiomegaly. Increased interstitial markings raise concern for mild pulmonary edema. Electronically Signed   By: Roanna Raider M.D.   On: 09/11/2015 06:06   Ct Head Wo Contrast  09/11/2015  CLINICAL DATA:  Status post fall, hitting back of head. Patient on Coumadin. Headache. Initial encounter. EXAM: CT HEAD WITHOUT CONTRAST TECHNIQUE: Contiguous axial images were obtained from the base of the skull through the vertex without intravenous contrast. COMPARISON:  None. FINDINGS: There  is no evidence of acute infarction, mass lesion, or intra- or extra-axial hemorrhage on CT. Prominence of the ventricles and sulci reflects mild to moderate cortical volume loss. Diffuse periventricular and subcortical white matter change likely reflects small vessel ischemic microangiopathy. Cerebellar atrophy is noted. Mild chronic ischemic change is noted at the right basal ganglia. The brainstem and fourth ventricle are within normal limits. The cerebral hemispheres demonstrate grossly normal gray-white differentiation. No mass effect or midline shift is seen. There is no evidence of fracture; visualized osseous structures are  unremarkable in appearance. The orbits are within normal limits. Mucoperiosteal thickening is noted at the sphenoid sinus. The remaining paranasal sinuses and mastoid air cells are well-aerated. No significant soft tissue abnormalities are seen. IMPRESSION: 1. No evidence of traumatic intracranial injury or fracture. 2. Mild to moderate cortical volume and diffuse small vessel ischemic microangiopathy. 3. Mild chronic ischemic change at the right basal ganglia. 4. Mucoperiosteal thickening at the sphenoid sinus. Electronically Signed   By: Roanna Raider M.D.   On: 09/11/2015 07:07   Dg Knee Complete 4 Views Right  09/11/2015  CLINICAL DATA:  Status post fall, with right hip pain. Initial encounter. EXAM: RIGHT KNEE - COMPLETE 4+ VIEW COMPARISON:  None. FINDINGS: There is no evidence of fracture or dislocation. The patient's total knee arthroplasty is grossly unremarkable in appearance, without evidence of loosening. The joint spaces are grossly preserved. No significant degenerative change is seen; the patellofemoral joint is grossly unremarkable in appearance. No significant joint effusion is seen. Diffuse vascular calcifications are seen. IMPRESSION: 1. No evidence of fracture or dislocation. Total knee arthroplasty is grossly unremarkable. 2. Diffuse vascular calcifications seen. Electronically Signed   By: Roanna Raider M.D.   On: 09/11/2015 06:01   Dg Hip Operative Unilat With Pelvis Right  09/13/2015  CLINICAL DATA:  Right femoral fracture ORIF. EXAM: OPERATIVE RIGHT HIP (WITH PELVIS IF PERFORMED) TWO VIEWS TECHNIQUE: Fluoroscopic spot image(s) were submitted for interpretation post-operatively. COMPARISON:  09/11/2015 FINDINGS: Three intraoperative spot fluoroscopic images of the right hip are provided, 1 frontal and 2 lateral projections. Intertrochanteric femur fracture has been reduced with apparent near anatomic alignment on these limited images. An antegrade intramedullary nail has been placed  with 2 proximal screws traversing the fracture and terminating in the femoral head. IMPRESSION: Intraoperative images during ORIF of intertrochanteric right femur fracture. Electronically Signed   By: Sebastian Ache M.D.   On: 09/13/2015 14:53   Dg Hip Unilat  With Pelvis 2-3 Views Right  09/11/2015  CLINICAL DATA:  Status post fall, with right hip pain. Initial encounter. EXAM: DG HIP (WITH OR WITHOUT PELVIS) 2-3V RIGHT COMPARISON:  None. FINDINGS: There is a mildly displaced right femoral intertrochanteric fracture, with mild medial angulation. No additional fractures are seen. The right femoral head remains seated at the acetabulum. Axial joint space narrowing is noted at the left hip. The sacroiliac joints are grossly unremarkable. Mild degenerative change is noted at the lower lumbar spine. Diffuse vascular calcifications are seen. Mild chronic deformity is noted at the right superior and inferior pubic rami. IMPRESSION: 1. Mildly displaced right femoral intertrochanteric fracture, with mild medial angulation. 2. Diffuse vascular calcifications seen. Electronically Signed   By: Roanna Raider M.D.   On: 09/11/2015 05:55    ASSESSMENT AND PLAN Principal Problem:   Closed right hip fracture [facility]) Active Problems:   Atrial fibrillation (HCC)   Hypertension   Pain, acute due to trauma   Fracture of right hip (HCC)  Pleural effusion   Anticoagulated on Coumadin   Aortic stenosis   Pacemaker   Postoperative anemia due to acute blood loss    Plan: The patient is confused and only oriented to self. Evaluation per primary. Rate controlled afib with intermittent V paced and PVCs on telemetry. Device interrogation showed >80% V pacing. 2 episodes of high ventricular rate  171 for 6 sec  (05/07/15) and 175 for 5 sec (06/26/15). Warfarin for anticoagulation. Inaccurate weight recorded today 101lb from 227lb. Mild ankle edema. Lungs clear. BP stable. Scr normal. Will discuss further plan with MD.      Lorelei Pont PA-C Pager [phone]

## 2015-09-15 NOTE — Progress Notes (Signed)
ANTICOAGULATION CONSULT NOTE  Pharmacy Consult for Lovenox and Coumadin Indication: atrial fibrillation and history of stroke  No Known Allergies  Patient Measurements: Weight: 101 lb 3.2 oz (45.904 kg) Heparin Dosing Weight:   Vital Signs: Temp: 97.9 F (36.6 C) (02/24 0500) Temp Source: Oral (02/24 0500) BP: 111/66 mmHg (02/24 0500) Pulse Rate: 81 (02/24 0500)  Labs:  Recent Labs  09/13/15 0759 09/14/15 0418 09/15/15 0616  HGB 12.0* 10.4* 10.4*  HCT 36.5* 31.9* 31.4*  PLT 98* 103* 99*  LABPROT  --  16.5* 19.7*  INR  --  1.32 1.67*  CREATININE 0.70 0.82 0.90    CrCl cannot be calculated (Unknown ideal weight.).   Medical History: Past Medical History  Diagnosis Date  . Anemia   . Hypertension   . Heart disease   . Hearing loss   . Irregular heartbeat   . Stroke (HCC)   . Fracture of right hip (HCC)   . Presence of permanent cardiac pacemaker     [employer]; placed October 2014    Medications:  Scheduled:  . enoxaparin (LOVENOX) injection  40 mg Subcutaneous Q24H  . lisinopril  20 mg Oral Daily  . potassium chloride  10 mEq Oral Daily  . sertraline  50 mg Oral Daily  . sodium chloride flush  3 mL Intravenous Q12H  . timolol  1 drop Right Eye Daily  . Warfarin - Pharmacist Dosing Inpatient   Does not apply q1800    Assessment: 82yo male with hx AFib and stroke, admitted 2/20 s/p fall in which he hit his hip/knee and head.  Now s/p IM nail to R-hip and to resume Coumadin with Lovenox (prophylactic) bridge.  INR was subtherapeutic on admission 1.26; Vit K was ordered but not given.  Dr Roda Shutters wants prophx LMWH until INR>2  Home dose of Coumadin is  daily (  tab +  tab), with stated last dose as 2/19, but question his compliance given admission INR 1.26  2/20  CT (-)hemorrhage  INR increasing appropriately, currently 1.67.   Goal of Therapy:  INR 2-3 Monitor platelets by anticoagulation protocol: Yes   Plan:  1-  Lovenox  SQ q24 until INR  > 2 2-  Coumadin 7.5mg  x 1  3-  Daily INR     Agapito Games, PharmD, BCPS Clinical Pharmacist Pager: [phone] 09/15/2015 10:44 AM

## 2015-09-15 NOTE — Clinical Social Work Note (Signed)
Patient to be d/c'ed today to [facility].  Patient and family agreeable to plans will transport via ems RN to call report.  Avedis Bevis, MSW, LCSWA [phone]  

## 2015-09-15 NOTE — Progress Notes (Signed)
Pt picked up by [facility] to be transported to disposition. Pt transported off unit via stretcher with belongings to the side. P. Amo Raif Chachere RN. 

## 2015-09-15 NOTE — NC FL2 (Signed)
[place] MEDICAID FL2 LEVEL OF CARE SCREENING TOOL     IDENTIFICATION  Patient Name: Timothy Joyce Birthdate: [birth year] Sex: male Admission Date (Current Location): 09/11/2015  [facility] and [place] Number:  Producer, television/film/video and Address:  [facility], [address], [place], [place] [address]      Provider Number: [id number]  Attending Physician Name and Address:  Elease Etienne, MD  Relative Name and Phone Number:  Johnjoseph Rolfe [phone]    Current Level of Care: Hospital Recommended Level of Care: Skilled Nursing Facility Prior Approval Number:    Date Approved/Denied:   PASRR Number: [id number]  Discharge Plan: SNF    Current Diagnoses: Patient Active Problem List   Diagnosis Date Noted  . Aortic stenosis 09/14/2015  . Pacemaker 09/14/2015  . Postoperative anemia due to acute blood loss   . Anticoagulated on Coumadin   . Closed right hip fracture (HCC) 09/11/2015  . Atrial fibrillation (HCC) 09/11/2015  . Hypertension 09/11/2015  . Pain, acute due to trauma 09/11/2015  . Fracture of right hip (HCC) 09/11/2015  . Pleural effusion     Orientation RESPIRATION BLADDER Height & Weight     Self, Time  Normal Continent Weight: 101 lb 3.2 oz (45.904 kg) Height:     BEHAVIORAL SYMPTOMS/MOOD NEUROLOGICAL BOWEL NUTRITION STATUS      Continent Diet (Regular Diet)  AMBULATORY STATUS COMMUNICATION OF NEEDS Skin   Limited Assist Verbally Surgical wounds                       Personal Care Assistance Level of Assistance  Bathing, Dressing Bathing Assistance: Limited assistance   Dressing Assistance: Limited assistance     Functional Limitations Info             SPECIAL CARE FACTORS FREQUENCY  PT (By licensed PT)     PT Frequency: 5x a week              Contractures      Additional Factors Info  Allergies, Code Status Code Status Info: Full  Code Allergies Info: NKA           Current Medications  (09/15/2015):  This is the current hospital active medication list Current Facility-Administered Medications  Medication Dose Route Frequency Provider Last Rate Last Dose  . acetaminophen (TYLENOL) tablet 650 mg  650 mg Oral Q6H PRN Naiping Donnelly Stager, MD       Or  . acetaminophen (TYLENOL) suppository 650 mg  650 mg Rectal Q6H PRN Naiping Donnelly Stager, MD      . alum & mag hydroxide-simeth (MAALOX/MYLANTA) 200-200-20 MG/5ML suspension 30 mL  30 mL Oral Q4H PRN Naiping Donnelly Stager, MD      . bisacodyl (DULCOLAX) EC tablet 5 mg  5 mg Oral Daily PRN Marcos Eke, PA-C      . enoxaparin (LOVENOX) injection 40 mg  40 mg Subcutaneous Q24H Tarry Kos, MD   40 mg at 09/15/15 0835  . HYDROcodone-acetaminophen (NORCO/VICODIN) 5-325 MG per tablet 1-2 tablet  1-2 tablet Oral Q6H PRN Naiping Donnelly Stager, MD      . lactated ringers infusion   Intravenous Continuous Cecile Hearing, MD 10 mL/hr at 09/13/15 1242    . lisinopril (PRINIVIL,ZESTRIL) tablet 20 mg  20 mg Oral Daily Marcos Eke, PA-C   20 mg at 09/15/15 8469  . menthol-cetylpyridinium (CEPACOL) lozenge 3 mg  1 lozenge Oral PRN Naiping Donnelly Stager, MD  Or  . phenol (CHLORASEPTIC) mouth spray 1 spray  1 spray Mouth/Throat PRN Naiping Donnelly Stager, MD      . methocarbamol (ROBAXIN) tablet 500 mg  500 mg Oral Q6H PRN Tarry Kos, MD   500 mg at 09/15/15 4098   Or  . methocarbamol (ROBAXIN) 500 mg in dextrose 5 % 50 mL IVPB  500 mg Intravenous Q6H PRN Naiping Donnelly Stager, MD      . metoCLOPramide (REGLAN) tablet 5-10 mg  5-10 mg Oral Q8H PRN Naiping Donnelly Stager, MD       Or  . metoCLOPramide (REGLAN) injection 5-10 mg  5-10 mg Intravenous Q8H PRN Naiping Donnelly Stager, MD      . morphine 2 MG/ML injection 0.5 mg  0.5 mg Intravenous Q2H PRN Naiping Donnelly Stager, MD      . ondansetron (ZOFRAN) tablet 4 mg  4 mg Oral Q6H PRN Naiping Donnelly Stager, MD       Or  . ondansetron (ZOFRAN) injection 4 mg  4 mg Intravenous Q6H PRN Naiping Donnelly Stager, MD      . oxyCODONE (Oxy IR/ROXICODONE) immediate release tablet 5-10 mg  5-10 mg  Oral Q4H PRN Naiping Donnelly Stager, MD      . potassium chloride (K-DUR) CR tablet 10 mEq  10 mEq Oral Daily Naiping Donnelly Stager, MD   10 mEq at 09/15/15 0835  . senna-docusate (Senokot-S) tablet 1 tablet  1 tablet Oral QHS PRN Marcos Eke, PA-C      . sertraline (ZOLOFT) tablet 50 mg  50 mg Oral Daily Marcos Eke, PA-C   50 mg at 09/15/15 1191  . sodium chloride flush (NS) 0.9 % injection 3 mL  3 mL Intravenous Q12H Marcos Eke, PA-C   3 mL at 09/15/15 1000  . timolol (TIMOPTIC) 0.5 % ophthalmic solution 1 drop  1 drop Right Eye Daily Marcos Eke, PA-C   1 drop at 09/15/15 0957  . warfarin (COUMADIN) tablet 7.5 mg  7.5 mg Oral ONCE-1800 Darl Householder Masters, [facility]      . Warfarin - Pharmacist Dosing Inpatient   Does not apply q1800 Renaee Munda, RPH   0  at 09/14/15 1800     Discharge Medications: Please see discharge summary for a list of discharge medications.  Relevant Imaging Results:  Relevant Lab Results:   Additional Information [id number]  Darleene Cleaver, [place]

## 2015-09-15 NOTE — Clinical Social Work Note (Signed)
Clinical Social Work Assessment  Patient Details  Name: Timothy Joyce MRN: [id number] Date of Birth: 1925/12/04  Date of referral:  09/15/15               Reason for consult:  Facility Placement                Permission sought to share information with:    Permission granted to share information::  Yes, Verbal Permission Granted  Name::     Timothy Joyce  Agency::  SNF admissions  Relationship::     Contact Information:     Housing/Transportation Living arrangements for the past 2 months:  ALF Source of Information:  Patient Patient Interpreter Needed:  None Criminal Activity/Legal Involvement Pertinent to Current Situation/Hospitalization:  No - Comment as needed Significant Relationships:  Adult Children Lives with:  Assisted living facility Do you feel safe going back to the place where you live?  No (Patient needs some short term rehab before he goes home) Need for family participation in patient care:  Yes (Comment)  Care giving concerns: Patient and family would like him to go to SNF for short term rehab   Social Worker assessment / plan:  Patient is a 82 year old male who lives at an ALF, patient plans to return back to facility once he has received some short term rehab.  Patient is Alert and oriented x2 and asks for his son to make decisions for him.  Patient has been in rehab in the past, and patient's son would like him to go to [facility].  Patient's son is involved in his care, and decision making, CSW explained role to patient's son and explained how insurance pays for patient's stay at [facility].  Patient son expressed he did not have any other questions. Employment status:  Retired Health and safety inspector:  Medicare PT Recommendations:  Skilled Nursing Facility Information / Referral to community resources:  Skilled Nursing Facility  Patient/Family's Response to care: Patient agreeable to SNF for rehab  Patient/Family's Understanding of and Emotional Response to Diagnosis,  Current Treatment, and Prognosis:  Patient and family aware of current treatment plan and prognosis.  Emotional Assessment Appearance:  Appears stated age Attitude/Demeanor/Rapport:    Affect (typically observed):  Calm, Pleasant Orientation:  Oriented to Self, Oriented to Place Alcohol / Substance use:  Not Applicable Psych involvement (Current and /or in the community):  No (Comment)  Discharge Needs  Concerns to be addressed:  No discharge needs identified Readmission within the last 30 days:  No Current discharge risk:  None Barriers to Discharge:  No Barriers Identified   Darleene Cleaver, LCSWA 09/15/2015, 3:25 PM

## 2015-09-15 NOTE — Progress Notes (Signed)
Pt discharge education and instructions completed. Pt discharge to [facility] with [facility] to transport pt to disposition. Report called off to RN Marisue Ivan DON of [facility]; pt IV and telemetry removed; RLE incision dsg remains clean, dry and intact; condom cath remains intact. Pt awaiting on [facility] to transport him to disposition and will continue to monitor pt till pick up. P. Amo Marquon Alcala Rn

## 2015-09-15 NOTE — Progress Notes (Signed)
   Subjective:  Patient is oriented to self only.  Objective:   VITALS:   Filed Vitals:   09/14/15 0500 09/14/15 1500 09/14/15 2100 09/15/15 0500  BP: 118/81 106/49 107/55 111/66  Pulse: 84 70 78 81  Temp: 98.6 F (37 C) 98.7 F (37.1 C) 98.2 F (36.8 C) 97.9 F (36.6 C)  TempSrc: Oral Oral Oral Oral  Resp: Weight: 103.1 kg (227 lb 4.7 oz)   45.904 kg (101 lb 3.2 oz)  SpO2: 96% 98% 93% 94%    Neurovascular intact Sensation intact distally Intact pulses distally Dorsiflexion/Plantar flexion intact Incision: dressing C/D/I and no drainage No cellulitis present Compartment soft   Lab Results  Component Value Date   WBC 4.5 09/14/2015   HGB 10.4* 09/14/2015   HCT 31.9* 09/14/2015   MCV 91.1 09/14/2015   PLT 103* 09/14/2015     Assessment/Plan:  2 Days Post-Op   - Expected postop acute blood loss anemia - will monitor for symptoms - Up with PT/OT - DVT ppx - SCDs, ambulation, lovenox - WBAT operative extremity - SNF pending  Cheral Almas 09/15/2015, 7:02 AM [phone]

## 2015-09-15 NOTE — Discharge Summary (Addendum)
Physician Discharge Summary  Timothy Joyce  ZOX:[id number]  DOB: [birth year]  DOA: 09/11/2015  PCP: No primary care provider on file.  Admit date: 09/11/2015 Discharge date: 09/15/2015  Time spent: Greater than 30 minutes  Recommendations for Outpatient Follow-up:  1. M.D. at SNF in 2-3 days. 2. Repeat daily labs (CBC, PT & INR) beginning 2/25/173 days. BMP in 1 week. Adjust Coumadin dose based on her lab work. DISCONTINUE Lovenox when INR therapeutic >2.  3. Weightbearing as tolerated on right lower extremity. Dry dressing change daily and as needed to right hip postop site. 4. Dr. Gershon Mussel, Orthopedics in 2 weeks for suture removal and wound recheck. SNF to arrange follow-up. 5. Dr. Tobias Alexander, Cardiology: SNF to arrange follow-up appointment. Cardiology can arrange for follow-up in their device clinic.  Discharge Diagnoses:  Principal Problem:   Closed right hip fracture (HCC) Active Problems:   Atrial fibrillation (HCC)   Hypertension   Pain, acute due to trauma   Fracture of right hip (HCC)   Pleural effusion   Anticoagulated on Coumadin   Aortic stenosis   Pacemaker   Postoperative anemia due to acute blood loss   Discharge Condition: Improved & Stable  Diet recommendation: Heart healthy diet.   Filed Weights   09/13/15 0508 09/14/15 0500 09/15/15 0500  Weight: 100.7 kg (222 lb 0.1 oz) 103.1 kg (227 lb 4.7 oz) 45.904 kg (101 lb 3.2 oz)    History of present illness:  82 year old male patient with history of anemia, HTN, PAF on Coumadin, prior stroke, dementia, hard of hearing, presented to [facility] ED on 09/11/15 following a mechanical fall at home. CT head negative for acute abnormalities. X-rays confirmed a right hip fracture. Orthopedics was consulted. After preop cardiac clearance, patient underwent ORIF.   Hospital Course:   Closed Right Hip Fracture:  - hip x ray showed mildly displaced right femoral intertrochanteric fracture, with mild medial angulation. -  After risk stratification by cardiology preoperatively (assessed as high risk), patient underwent ORIF on 2/22. Management per orthopedics.  - Patient on Lovenox for DVT prophylaxis which can be discontinued after INR is therapeutic >2 (patient on Coumadin for A. fib). Orthopedics recommends weightbearing as tolerated and outpatient follow-up with them in 2 weeks for wound recheck and suture removal. Pain is controlled. Foley catheter discontinued yesterday and patient voiding.  - Discussed with Dr. Roda Shutters who agrees with discharge.   Chronic Atrial Fibrillation  - with ventricularly paced rhythm, CHADs2vasc = 6, (HTN, age, stroke, vascular disease). Patient was on warfarin 6 mg daily prior to admission.  - INR 1.6 - Cardiology follow-up appreciated. Pacer interrogation showed adequate battery life-likely permanent A. fib. Cardiology will arrange for pacer remote check to be done locally if he is staying in the area for SNF recovery. Cardiology recommends follow-up with Dr. Delton See for aortic stenosis and diastolic CHF and in their device clinic after discharge.  Severe pulmonary hypertension  - with moderately dilated RV and massively dilated right atrium and moderate TR. PA peak pressure 88 mm Hg, with evidence of volume overload on exam, CXR, and ECHO -Treated with a dose of IV Lasix yesterday with some diuresis. Cardiology recommends Lasix 20 MG daily rather than when necessary.  Probable moderate to severe aortic valve stenosis, mild to moderate MR - Outpatient follow-up with cardiology.  CAD s/p stent placement in 1990s per patient report - Stable. Outpatient follow-up with cardiology.  Hypertension.  - Continue lisinopril. Controlled  Dementia without behavioral abnormalities - Mental status likely  at baseline.  Hx of CVA,  - likely due to a-fib and now on warfarin although INR subtherapeutic but is getting to therapeutic range and should be there in the next 2-3  days.  Leukopenia, mild and relatively stable - Resolved.  Normocytic anemia, likely of chronic disease/acute postop blood loss anemia - Drop in hemoglobin from 12-10.4 related to surgery. Stable. Follow CBC at SNF.  Thrombocytopenia, stable. Follow CBCs periodically..     Consultants:  Orthopedic surgery  Cardiology  Procedures:  ORIF right hip on 2/22  Foley catheter-discontinued 2/23  2-D echo 09/12/15: Study Conclusions  - Left ventricle: The cavity size was normal. Systolic function was normal. The estimated ejection fraction was in the range of 50% to 55%. Wall motion was normal; there were no regional wall motion abnormalities. - Aortic valve: The peak velocity and mean gradient are consistent with mild AS. This does not appear accurate. Visually the AV appears moderate to severely stenotic. The planimetered AVA is calculated at 1cm2 consistent with moderate to severe AS. Recommend repeat limited echo to assess AV further. Severe diffuse thickening and calcification. Valve mobility was restricted. There was mild regurgitation. Valve area (VTI): 1.62 cm^2. Valve area (Vmax): 1.61 cm^2. Valve area (Vmean): 1.59 cm^2. - Aorta: The aortic root is moderately dilated at the Sinuses of Valsalva. Aortic root dimension: 48 mm (ED). - Mitral valve: Calcified annulus. There was mild to moderate regurgitation. - Right ventricle: The cavity size was moderately dilated. Wall thickness was normal. - Right atrium: The atrium was massively dilated. - Tricuspid valve: There was moderate regurgitation. - Pulmonary arteries: PA peak pressure: 88 mm Hg (S). - Recommendations: Recommend repeat limited echo to assess AV further and verify severity of AS.  Impressions:  - The right ventricular systolic pressure was increased consistent with severe pulmonary hypertension.   Discharge Exam:  Complaints: Presently confused. Denies complaints.  No pain reported. As per RN, no acute issues. Foley catheter removed yesterday and patient has voided since. Tolerating diet.  Filed Vitals:   09/14/15 1500 09/14/15 2100 09/15/15 0500 09/15/15 1344  BP: 106/49 107/55 111/66 116/80  Pulse: 70 78 81 72  Temp: 98.7 F (37.1 C) 98.2 F (36.8 C) 97.9 F (36.6 C) 98.3 F (36.8 C)  TempSrc: Oral Oral Oral   Resp: Weight:   45.904 kg (101 lb 3.2 oz)   SpO2: 98% 93% 94% 96%    General exam: Pleasant elderly male lying comfortably supine in bed.  Respiratory system: Clear. No increased work of breathing. Cardiovascular system: S1 & S2 heard, RRR. No JVD, murmurs, gallops, clicks or pedal edema.Telemetry: A. fib with controlled ventricular rate and periodic V paced rhythm.  Gastrointestinal system: Abdomen is nondistended, soft and nontender. Normal bowel sounds heard. Central nervous system: Alert and oriented only to self. No focal neurological deficits. Extremities: Symmetric 5 x 5 power.Right hip postop dressing site clean and dry.  Discharge Instructions      Discharge Instructions    (HEART FAILURE PATIENTS) Call MD:  Anytime you have any of the following symptoms: 1) 3 pound weight gain in 24 hours or 5 pounds in 1 week 2) shortness of breath, with or without a dry hacking cough 3) swelling in the hands, feet or stomach 4) if you have to sleep on extra pillows at night in order to breathe.    Complete by:  As directed      Call MD for:  difficulty breathing, headache or visual  disturbances    Complete by:  As directed      Call MD for:  extreme fatigue    Complete by:  As directed      Call MD for:  persistant dizziness or light-headedness    Complete by:  As directed      Call MD for:  persistant nausea and vomiting    Complete by:  As directed      Call MD for:  redness, tenderness, or signs of infection (pain, swelling, redness, odor or green/yellow discharge around incision site)    Complete by:  As directed       Call MD for:  severe uncontrolled pain    Complete by:  As directed      Call MD for:  temperature >100.4    Complete by:  As directed      Diet - low sodium heart healthy    Complete by:  As directed      Increase activity slowly    Complete by:  As directed      Weight bearing as tolerated    Complete by:  As directed             Medication List    TAKE these medications        enoxaparin 40 MG/0.4ML injection  Commonly known as:  LOVENOX  Inject 0.4 mLs (40 mg total) into the skin daily.     furosemide 20 MG tablet  Commonly known as:  LASIX  Take 1 tablet (20 mg total) by mouth daily.     HYDROcodone-acetaminophen 7.5-325 MG tablet  Commonly known as:  NORCO  Take 1-2 tablets by mouth every 6 (six) hours as needed for moderate pain.     lisinopril 20 MG tablet  Commonly known as:  PRINIVIL,ZESTRIL  Take 20 mg by mouth daily.     potassium chloride 10 MEQ tablet  Commonly known as:  K-DUR  Take 10 mEq by mouth daily.     sertraline 50 MG tablet  Commonly known as:  ZOLOFT  Take 50 mg by mouth daily.     timolol 0.5 % ophthalmic solution  Commonly known as:  BETIMOL  Place 1 drop into the right eye daily.     warfarin 1 MG tablet  Commonly known as:  COUMADIN  Take 1 mg by mouth daily.     warfarin 5 MG tablet  Commonly known as:  COUMADIN  Take 5 mg by mouth daily.       Follow-up Information    Follow up with Cheral Almas, MD. Schedule an appointment as soon as possible for a visit in 2 weeks.   Specialty:  Orthopedic Surgery   Why:  For suture removal, For wound re-check   Contact information:   [address] [place] [address] [phone]       Schedule an appointment as soon as possible for a visit with Lars Masson, MD.   Specialty:  Cardiology   Contact information:   [address] [place] [address] [phone]       Get Medicines reviewed and adjusted: Please take all your  medications with you for your next visit with your Primary MD  Please request your Primary MD to go over all hospital tests and procedure/radiological results at the follow up. Please ask your Primary MD to get all Hospital records sent to his/her office.  If you experience worsening of your admission symptoms, develop shortness  of breath, life threatening emergency, suicidal or homicidal thoughts you must seek medical attention immediately by calling 911 or calling your MD immediately if symptoms less severe.  You must read complete instructions/literature along with all the possible adverse reactions/side effects for all the Medicines you take and that have been prescribed to you. Take any new Medicines after you have completely understood and accept all the possible adverse reactions/side effects.   Do not drive when taking pain medications.   Do not take more than prescribed Pain, Sleep and Anxiety Medications  Special Instructions: If you have smoked or chewed Tobacco in the last 2 yrs please stop smoking, stop any regular Alcohol and or any Recreational drug use.  Wear Seat belts while driving.  Please note  You were cared for by a hospitalist during your hospital stay. Once you are discharged, your primary care physician will handle any further medical issues. Please note that NO REFILLS for any discharge medications will be authorized once you are discharged, as it is imperative that you return to your primary care physician (or establish a relationship with a primary care physician if you do not have one) for your aftercare needs so that they can reassess your need for medications and monitor your lab values.    The results of significant diagnostics from this hospitalization (including imaging, microbiology, ancillary and laboratory) are listed below for reference.    Significant Diagnostic Studies: Dg Chest 1 View  09/11/2015  CLINICAL DATA:  Status post fall, with concern for  chest injury. Initial encounter. EXAM: CHEST 1 VIEW COMPARISON:  None. FINDINGS: The lungs are well-aerated. A small right pleural effusion is noted. The left costophrenic angle is incompletely imaged on this study. Vascular congestion is seen, with increased interstitial markings, concerning for mild pulmonary edema. No pneumothorax is seen. The cardiomediastinal silhouette is enlarged. A pacemaker is noted, with a single lead ending at the right ventricle. No acute osseous abnormalities are seen. IMPRESSION: Small right pleural effusion. Vascular congestion and cardiomegaly. Increased interstitial markings raise concern for mild pulmonary edema. Electronically Signed   By: Roanna Raider M.D.   On: 09/11/2015 06:06   Ct Head Wo Contrast  09/11/2015  CLINICAL DATA:  Status post fall, hitting back of head. Patient on Coumadin. Headache. Initial encounter. EXAM: CT HEAD WITHOUT CONTRAST TECHNIQUE: Contiguous axial images were obtained from the base of the skull through the vertex without intravenous contrast. COMPARISON:  None. FINDINGS: There is no evidence of acute infarction, mass lesion, or intra- or extra-axial hemorrhage on CT. Prominence of the ventricles and sulci reflects mild to moderate cortical volume loss. Diffuse periventricular and subcortical white matter change likely reflects small vessel ischemic microangiopathy. Cerebellar atrophy is noted. Mild chronic ischemic change is noted at the right basal ganglia. The brainstem and fourth ventricle are within normal limits. The cerebral hemispheres demonstrate grossly normal gray-white differentiation. No mass effect or midline shift is seen. There is no evidence of fracture; visualized osseous structures are unremarkable in appearance. The orbits are within normal limits. Mucoperiosteal thickening is noted at the sphenoid sinus. The remaining paranasal sinuses and mastoid air cells are well-aerated. No significant soft tissue abnormalities are seen.  IMPRESSION: 1. No evidence of traumatic intracranial injury or fracture. 2. Mild to moderate cortical volume and diffuse small vessel ischemic microangiopathy. 3. Mild chronic ischemic change at the right basal ganglia. 4. Mucoperiosteal thickening at the sphenoid sinus. Electronically Signed   By: Roanna Raider M.D.   On: 09/11/2015 07:07  Dg Knee Complete 4 Views Right  09/11/2015  CLINICAL DATA:  Status post fall, with right hip pain. Initial encounter. EXAM: RIGHT KNEE - COMPLETE 4+ VIEW COMPARISON:  None. FINDINGS: There is no evidence of fracture or dislocation. The patient's total knee arthroplasty is grossly unremarkable in appearance, without evidence of loosening. The joint spaces are grossly preserved. No significant degenerative change is seen; the patellofemoral joint is grossly unremarkable in appearance. No significant joint effusion is seen. Diffuse vascular calcifications are seen. IMPRESSION: 1. No evidence of fracture or dislocation. Total knee arthroplasty is grossly unremarkable. 2. Diffuse vascular calcifications seen. Electronically Signed   By: Roanna Raider M.D.   On: 09/11/2015 06:01   Dg Hip Operative Unilat With Pelvis Right  09/13/2015  CLINICAL DATA:  Right femoral fracture ORIF. EXAM: OPERATIVE RIGHT HIP (WITH PELVIS IF PERFORMED) TWO VIEWS TECHNIQUE: Fluoroscopic spot image(s) were submitted for interpretation post-operatively. COMPARISON:  09/11/2015 FINDINGS: Three intraoperative spot fluoroscopic images of the right hip are provided, 1 frontal and 2 lateral projections. Intertrochanteric femur fracture has been reduced with apparent near anatomic alignment on these limited images. An antegrade intramedullary nail has been placed with 2 proximal screws traversing the fracture and terminating in the femoral head. IMPRESSION: Intraoperative images during ORIF of intertrochanteric right femur fracture. Electronically Signed   By: Sebastian Ache M.D.   On: 09/13/2015 14:53   Dg  Hip Unilat  With Pelvis 2-3 Views Right  09/11/2015  CLINICAL DATA:  Status post fall, with right hip pain. Initial encounter. EXAM: DG HIP (WITH OR WITHOUT PELVIS) 2-3V RIGHT COMPARISON:  None. FINDINGS: There is a mildly displaced right femoral intertrochanteric fracture, with mild medial angulation. No additional fractures are seen. The right femoral head remains seated at the acetabulum. Axial joint space narrowing is noted at the left hip. The sacroiliac joints are grossly unremarkable. Mild degenerative change is noted at the lower lumbar spine. Diffuse vascular calcifications are seen. Mild chronic deformity is noted at the right superior and inferior pubic rami. IMPRESSION: 1. Mildly displaced right femoral intertrochanteric fracture, with mild medial angulation. 2. Diffuse vascular calcifications seen. Electronically Signed   By: Roanna Raider M.D.   On: 09/11/2015 05:55    Microbiology: Recent Results (from the past 240 hour(s))  Surgical pcr screen     Status: None   Collection Time: 09/13/15  8:57 AM  Result Value Ref Range Status   MRSA, PCR NEGATIVE NEGATIVE Final   Staphylococcus aureus NEGATIVE NEGATIVE Final    Comment:        The Xpert SA Assay (FDA approved for NASAL specimens in patients over 29 years of age), is one component of a comprehensive surveillance program.  Test performance has been validated by [facility] for patients greater than or equal to 17 year old. It is not intended to diagnose infection nor to guide or monitor treatment.      Labs: Basic Metabolic Panel:  Recent Labs Lab 09/11/15 0515 09/12/15 0725 09/13/15 0759 09/14/15 0418 09/15/15 0616  NA 138 140 140 141 136  K 4.7 3.8 3.2* 3.9 3.7  CL 106 107 102 105 103  CO2 GLUCOSE 97 89 106* 114* 115*  BUN 21* 28*  CREATININE 0.77 0.85 0.70 0.82 0.90  CALCIUM 8.8* 8.7* 8.5* 8.2* 8.2*   Liver Function Tests: No results for input(s): AST, ALT, ALKPHOS, BILITOT,  PROT, ALBUMIN in the last 168 hours. No results for input(s): LIPASE, AMYLASE  in the last 168 hours. No results for input(s): AMMONIA in the last 168 hours. CBC:  Recent Labs Lab 09/11/15 0515 09/12/15 0725 09/13/15 0759 09/14/15 0418 09/15/15 0616  WBC 3.6* 3.9* 3.9* 4.5 5.0  NEUTROABS 1.9  --   --   --   --   HGB 11.6* 12.2* 12.0* 10.4* 10.4*  HCT 35.2* 37.3* 36.5* 31.9* 31.4*  MCV 93.1 93.0 92.4 91.1 92.1  PLT 126* 97* 98* 103* 99*   Cardiac Enzymes: No results for input(s): CKTOTAL, CKMB, CKMBINDEX, TROPONINI in the last 168 hours. BNP: BNP (last 3 results)  Recent Labs  09/11/15 1942  BNP 657.7*    ProBNP (last 3 results) No results for input(s): PROBNP in the last 8760 hours.  CBG: No results for input(s): GLUCAP in the last 168 hours.   Discussed with patient's son [name]. Updated care and answered questions.  Signed:  Marcellus Scott, MD, FACP, FHM. [facility] Pager [phone]  If 7PM-7AM, please contact night-coverage [email or web address] Password [email or web address] 09/15/2015, 2:09 PM

## 2015-09-15 NOTE — Progress Notes (Signed)
PT Cancellation Note  Patient Details Name: Timothy Joyce MRN: [id number] DOB: March 30, 1926   Cancelled Treatment:    Reason Eval/Treat Not Completed: Other (comment). Talked with nursing, patient about to be D/C to SNF.    Christiane Ha, PT, CSCS Pager [phone] Office [phone]  09/15/2015, 4:16 PM

## 2015-09-18 ENCOUNTER — Encounter: Payer: Self-pay | Admitting: Internal Medicine

## 2015-09-18 ENCOUNTER — Non-Acute Institutional Stay (SKILLED_NURSING_FACILITY): Payer: Medicare Other | Admitting: Internal Medicine

## 2015-09-18 DIAGNOSIS — F29 Unspecified psychosis not due to a substance or known physiological condition: Secondary | ICD-10-CM

## 2015-09-18 DIAGNOSIS — D62 Acute posthemorrhagic anemia: Secondary | ICD-10-CM | POA: Diagnosis not present

## 2015-09-18 DIAGNOSIS — I10 Essential (primary) hypertension: Secondary | ICD-10-CM | POA: Diagnosis not present

## 2015-09-18 DIAGNOSIS — F329 Major depressive disorder, single episode, unspecified: Secondary | ICD-10-CM

## 2015-09-18 DIAGNOSIS — S72001D Fracture of unspecified part of neck of right femur, subsequent encounter for closed fracture with routine healing: Secondary | ICD-10-CM

## 2015-09-18 DIAGNOSIS — H409 Unspecified glaucoma: Secondary | ICD-10-CM | POA: Diagnosis not present

## 2015-09-18 DIAGNOSIS — R5381 Other malaise: Secondary | ICD-10-CM | POA: Diagnosis not present

## 2015-09-18 DIAGNOSIS — E876 Hypokalemia: Secondary | ICD-10-CM | POA: Diagnosis not present

## 2015-09-18 DIAGNOSIS — R600 Localized edema: Secondary | ICD-10-CM | POA: Diagnosis not present

## 2015-09-18 DIAGNOSIS — Z7901 Long term (current) use of anticoagulants: Secondary | ICD-10-CM | POA: Diagnosis not present

## 2015-09-18 DIAGNOSIS — F32A Depression, unspecified: Secondary | ICD-10-CM

## 2015-09-18 DIAGNOSIS — F23 Brief psychotic disorder: Secondary | ICD-10-CM

## 2015-09-18 NOTE — Progress Notes (Signed)
Patient ID: Timothy Joyce, male   DOB: Dec 20, 1925, 82 y.o.   MRN: [id number]    LOCATION: [facility]  PCP: No primary care provider on file.   Code Status: DNR  Goals of care: Advanced Directive information Advanced Directives 09/11/2015  Does patient have an advance directive? No       Extended Emergency Contact Information Primary Emergency Contact: [place], [place] Home Phone: [phone] Relation: Son   No Known Allergies  Chief Complaint  Patient presents with  . New Admit To SNF    New Admission     HPI:  Patient is a 82 y.o. male seen today for short term rehabilitation post hospital admission from 09/11/15-09/15/15 with closed right hip fracture. He underwent ORIF on 09/13/15. He has PMH of afib, HTN, aortic stenosis. He is seen in his room today. He is pleasantly confused this am. His INR today is 4. Per staff, he was agitated last night and was seeing pink elephant and large men in the hallway and by his window.   Review of Systems:  Constitutional: Negative for fever, chills  HENT: Negative for headache, congestion, nasal discharge Eyes: Negative for eye pain Respiratory: Negative for cough, shortness of breath and wheezing.   Cardiovascular: Negative for chest pain, palpitations.  Gastrointestinal: Negative for heartburn, nausea, vomiting, abdominal pain Genitourinary: Negative for flank pain.  Musculoskeletal: Negative for fall in the facility Skin: Negative for itching, rash.  Neurological: Negative for dizziness Psychiatric/Behavioral: Negative for depression   Past Medical History  Diagnosis Date  . Anemia   . Hypertension   . Heart disease   . Hearing loss   . Irregular heartbeat   . Stroke (HCC)   . Fracture of right hip (HCC)   . Presence of permanent cardiac pacemaker     Medtronic; placed October 2014   Past Surgical History  Procedure Laterality Date  . Joint replacement    . Right and left knee replacements    .  Insert / replace / remove pacemaker  04/2013    Medtronic  . Intramedullary (im) nail intertrochanteric Right 09/13/2015    Procedure: INTRAMEDULLARY (IM) NAIL RIGHT HIP INTERTROCHANTERIC;  Surgeon: Tarry Kos, MD;  Location: [facility] OR;  Service: Orthopedics;  Laterality: Right;   Social History:   reports that he has quit smoking. He has never used smokeless tobacco. He reports that he does not drink alcohol or use illicit drugs.  Family History  Problem Relation Age of Onset  . Family history unknown: Yes    Medications:   Medication List       This list is accurate as of: 09/18/15 10:58 AM.  Always use your most recent med list.               furosemide 20 MG tablet  Commonly known as:  LASIX  Take 1 tablet (20 mg total) by mouth daily.     HYDROcodone-acetaminophen 7.5-325 MG tablet  Commonly known as:  NORCO  Take 1-2 tablets by mouth every 6 (six) hours as needed for moderate pain.     lisinopril 20 MG tablet  Commonly known as:  PRINIVIL,ZESTRIL  Take 20 mg by mouth daily.     potassium chloride 10 MEQ tablet  Commonly known as:  K-DUR  Take 10 mEq by mouth daily.     sertraline 50 MG tablet  Commonly known as:  ZOLOFT  Take 50 mg by mouth daily.     timolol 0.5 % ophthalmic  solution  Commonly known as:  BETIMOL  Place 1 drop into the right eye daily.     warfarin 1 MG tablet  Commonly known as:  COUMADIN  Take 1 mg by mouth daily.     warfarin 5 MG tablet  Commonly known as:  COUMADIN  Take 5 mg by mouth daily.        Immunizations: Immunization History  Administered Date(s) Administered  . PPD Test 09/15/2015     Physical Exam: Filed Vitals:   09/18/15 1049  BP: 111/55  Pulse: 73  Temp: 96.6 F (35.9 C)  TempSrc: Oral  Resp: 18  Weight: 101 lb (45.813 kg)  SpO2: 95%   General- elderly frail and thin built, in no acute distress Head- normocephalic, atraumatic Nose- no maxillary or frontal sinus tenderness, no nasal discharge Throat-  moist mucus membrane Eyes- PERRLA, EOMI, no pallor, no icterus, no discharge, normal conjunctiva, normal sclera Neck- no cervical lymphadenopathy Cardiovascular- normal s1,s2, no murmurs, 1+ right and trace left leg edema Respiratory- bilateral clear to auscultation, no wheeze, no rhonchi, no crackles, no use of accessory muscles Abdomen- bowel sounds present, soft, non tender, has condom catheter Musculoskeletal- able to move all 4 extremities, limited right hip range of motion Neurological- pleasantly confused Skin- warm and dry, skin tear to right arm, surgical incision to right hip with staples and healing well Psychiatry- normal mood and affect    Labs reviewed: Basic Metabolic Panel:  Recent Labs  16/10/96 0759 09/14/15 0418 09/15/15 09/15/15 0616  NA 140 141 136* 136  K 3.2* 3.9  --  3.7  CL 102 105  --  103  CO2 26 25  --  26  GLUCOSE 106* 114*  --  115*  BUN 17 21* 28* 28*  CREATININE 0.70 0.82 0.9 0.90  CALCIUM 8.5* 8.2*  --  8.2*   CBC:  Recent Labs  09/11/15 0515  09/13/15 0759 09/14/15 0418 09/15/15 09/15/15 0616  WBC 3.6*  < > 3.9* 4.5 5.0 5.0  NEUTROABS 1.9  --   --   --   --   --   HGB 11.6*  < > 12.0* 10.4*  --  10.4*  HCT 35.2*  < > 36.5* 31.9*  --  31.4*  MCV 93.1  < > 92.4 91.1  --  92.1  PLT 126*  < > 98* 103*  --  99*  < > = values in this interval not displayed.   Radiological Exams: Dg Chest 1 View  09/11/2015  CLINICAL DATA:  Status post fall, with concern for chest injury. Initial encounter. EXAM: CHEST 1 VIEW COMPARISON:  None. FINDINGS: The lungs are well-aerated. A small right pleural effusion is noted. The left costophrenic angle is incompletely imaged on this study. Vascular congestion is seen, with increased interstitial markings, concerning for mild pulmonary edema. No pneumothorax is seen. The cardiomediastinal silhouette is enlarged. A pacemaker is noted, with a single lead ending at the right ventricle. No acute osseous  abnormalities are seen. IMPRESSION: Small right pleural effusion. Vascular congestion and cardiomegaly. Increased interstitial markings raise concern for mild pulmonary edema. Electronically Signed   By: Roanna Raider M.D.   On: 09/11/2015 06:06   Ct Head Wo Contrast  09/11/2015  CLINICAL DATA:  Status post fall, hitting back of head. Patient on Coumadin. Headache. Initial encounter. EXAM: CT HEAD WITHOUT CONTRAST TECHNIQUE: Contiguous axial images were obtained from the base of the skull through the vertex without intravenous contrast. COMPARISON:  None. FINDINGS:  There is no evidence of acute infarction, mass lesion, or intra- or extra-axial hemorrhage on CT. Prominence of the ventricles and sulci reflects mild to moderate cortical volume loss. Diffuse periventricular and subcortical white matter change likely reflects small vessel ischemic microangiopathy. Cerebellar atrophy is noted. Mild chronic ischemic change is noted at the right basal ganglia. The brainstem and fourth ventricle are within normal limits. The cerebral hemispheres demonstrate grossly normal gray-white differentiation. No mass effect or midline shift is seen. There is no evidence of fracture; visualized osseous structures are unremarkable in appearance. The orbits are within normal limits. Mucoperiosteal thickening is noted at the sphenoid sinus. The remaining paranasal sinuses and mastoid air cells are well-aerated. No significant soft tissue abnormalities are seen. IMPRESSION: 1. No evidence of traumatic intracranial injury or fracture. 2. Mild to moderate cortical volume and diffuse small vessel ischemic microangiopathy. 3. Mild chronic ischemic change at the right basal ganglia. 4. Mucoperiosteal thickening at the sphenoid sinus. Electronically Signed   By: Roanna Raider M.D.   On: 09/11/2015 07:07   Dg Knee Complete 4 Views Right  09/11/2015  CLINICAL DATA:  Status post fall, with right hip pain. Initial encounter. EXAM: RIGHT KNEE  - COMPLETE 4+ VIEW COMPARISON:  None. FINDINGS: There is no evidence of fracture or dislocation. The patient's total knee arthroplasty is grossly unremarkable in appearance, without evidence of loosening. The joint spaces are grossly preserved. No significant degenerative change is seen; the patellofemoral joint is grossly unremarkable in appearance. No significant joint effusion is seen. Diffuse vascular calcifications are seen. IMPRESSION: 1. No evidence of fracture or dislocation. Total knee arthroplasty is grossly unremarkable. 2. Diffuse vascular calcifications seen. Electronically Signed   By: Roanna Raider M.D.   On: 09/11/2015 06:01   Dg Hip Operative Unilat With Pelvis Right  09/13/2015  CLINICAL DATA:  Right femoral fracture ORIF. EXAM: OPERATIVE RIGHT HIP (WITH PELVIS IF PERFORMED) TWO VIEWS TECHNIQUE: Fluoroscopic spot image(s) were submitted for interpretation post-operatively. COMPARISON:  09/11/2015 FINDINGS: Three intraoperative spot fluoroscopic images of the right hip are provided, 1 frontal and 2 lateral projections. Intertrochanteric femur fracture has been reduced with apparent near anatomic alignment on these limited images. An antegrade intramedullary nail has been placed with 2 proximal screws traversing the fracture and terminating in the femoral head. IMPRESSION: Intraoperative images during ORIF of intertrochanteric right femur fracture. Electronically Signed   By: Sebastian Ache M.D.   On: 09/13/2015 14:53   Dg Hip Unilat  With Pelvis 2-3 Views Right  09/11/2015  CLINICAL DATA:  Status post fall, with right hip pain. Initial encounter. EXAM: DG HIP (WITH OR WITHOUT PELVIS) 2-3V RIGHT COMPARISON:  None. FINDINGS: There is a mildly displaced right femoral intertrochanteric fracture, with mild medial angulation. No additional fractures are seen. The right femoral head remains seated at the acetabulum. Axial joint space narrowing is noted at the left hip. The sacroiliac joints are  grossly unremarkable. Mild degenerative change is noted at the lower lumbar spine. Diffuse vascular calcifications are seen. Mild chronic deformity is noted at the right superior and inferior pubic rami. IMPRESSION: 1. Mildly displaced right femoral intertrochanteric fracture, with mild medial angulation. 2. Diffuse vascular calcifications seen. Electronically Signed   By: Roanna Raider M.D.   On: 09/11/2015 05:55    Assessment/Plan  Physical deconditioning Will have patient work with PT/OT as tolerated to regain strength and restore function.  Fall precautions are in place.  Right hip closed fracture S/p ORIF. Has f/u with orthopedics. Will have  him work with physical therapy and occupational therapy team to help with gait training and muscle strengthening exercises.fall precautions. Skin care. Encourage to be out of bed. Currently on norco 7.5-325 mg 1-2 tab q6h prn pain. Given his confusion and hallucination, change this to 1-2 tab q8h prn pain and have him on tylenol 1000 mg bid for now. Continue coumadin for dvt prophylaxis  Blood loss anemia Monitor cbc  Acute psychosis With hallucination and agitation last night. Currently appears pleasantly confused. Send u/a with c/s. Check cbc with diff, tsh and cmp. Reducing the frequency of pain medication to assess further. His pain medication, recent surgery and being in a new environment could be contributing some as well. If infectious workup is negative, will get dementia workup. Fall precautions for now  Leg edema Add ted hose. Continue lasix and check bmp  Long term anticoagulation inr today is 4. No bleed reported. Discontinue lovenox and hold coumadin. Check inr 09/19/15  HTN Stable, continue lisinopril 20 mg daily and lasix 20 mg daily. Check BP and BMP  Hypokalemia Continue kcl ad check bmp  Chronic depression Continue zoloft at bedtime  Glaucoma Continue timolol eye drop    Goals of care: short term  rehabilitation   Labs/tests ordered: cbc with diff, cmp, tsh  Family/ staff Communication: reviewed care plan with patient and nursing supervisor    Oneal Grout, MD Internal Medicine [facility] [address] [place], [place] [address] Cell Phone (Monday-Friday 8 am - 5 pm): [phone] On Call: [phone] and follow prompts after 5 pm and on weekends Office Phone: [phone] Office Fax: [phone]

## 2015-09-20 ENCOUNTER — Encounter (HOSPITAL_COMMUNITY): Payer: Self-pay | Admitting: Orthopaedic Surgery

## 2015-09-24 LAB — HEPATIC FUNCTION PANEL
ALT: 5 U/L — AB (ref 10–40)
AST: 16 U/L (ref 14–40)
Alkaline Phosphatase: 110 U/L (ref 25–125)
Bilirubin, Total: 1.8 mg/dL

## 2015-09-24 LAB — CBC AND DIFFERENTIAL
HEMATOCRIT: 26 % — AB (ref 41–53)
HEMOGLOBIN: 8.7 g/dL — AB (ref 13.5–17.5)
PLATELETS: 275 10*3/uL (ref 150–399)
WBC: 5 10*3/mL

## 2015-09-24 LAB — BASIC METABOLIC PANEL
BUN: 39 mg/dL — AB (ref 4–21)
Creatinine: 1 mg/dL (ref 0.6–1.3)
GLUCOSE: 103 mg/dL
POTASSIUM: 6 mmol/L — AB (ref 3.4–5.3)
Sodium: 136 mmol/L — AB (ref 137–147)

## 2015-09-25 ENCOUNTER — Encounter: Payer: Self-pay | Admitting: Adult Health

## 2015-09-25 ENCOUNTER — Non-Acute Institutional Stay (SKILLED_NURSING_FACILITY): Payer: Medicare Other | Admitting: Adult Health

## 2015-09-25 DIAGNOSIS — D62 Acute posthemorrhagic anemia: Secondary | ICD-10-CM | POA: Diagnosis not present

## 2015-09-25 DIAGNOSIS — E43 Unspecified severe protein-calorie malnutrition: Secondary | ICD-10-CM | POA: Diagnosis not present

## 2015-09-25 DIAGNOSIS — E875 Hyperkalemia: Secondary | ICD-10-CM | POA: Diagnosis not present

## 2015-09-25 NOTE — Progress Notes (Signed)
Patient ID: Timothy Joyce, male   DOB: 1925-12-11, 82 y.o.   MRN: [id number]    DATE:  09/25/15  MRN:  [id number]  BIRTHDAY: March 16, 1926  Facility:  Nursing Home Location:  [facility]  Nursing Home Room Number: [id number]  LEVEL OF CARE:  SNF (31)  Contact Information    Name Relation Home Work [place] Son [phone]         Code Status History    Date Active Date Inactive Code Status Order ID Comments User Context   09/11/2015  8:08 AM 09/15/2015  7:30 PM Full Code [id number]  Marcos Eke, PA-C ED    Advance Directive Documentation        Most Recent Value   Type of Advance Directive  Out of facility DNR (pink MOST or yellow form)   Pre-existing out of facility DNR order (yellow form or pink MOST form)     "MOST" Form in Place?         Chief Complaint  Patient presents with  . Acute Visit    Anemia, hyperkalemia, protein-calorie malnutrition    HISTORY OF PRESENT ILLNESS:  This is an 82 year old male who was noted to have hgb 8.7. Last hgb taken on 09/15/15 was 10.4 . He had a right hip fracture for which he had ORIF on 09/13/15. Latest K 6.0. No constipation nor numbness.  Albumin is 2.8, low.  PAST MEDICAL HISTORY:  Past Medical History  Diagnosis Date  . Anemia   . Hypertension   . Heart disease   . Hearing loss   . Irregular heartbeat   . Stroke (HCC)   . Fracture of right hip (HCC)   . Presence of permanent cardiac pacemaker     Medtronic; placed October 2014  . Atrial fibrillation (HCC)   . Pain, acute due to trauma   . Pleural effusion   . Anticoagulated on Coumadin   . Aortic stenosis   . Postoperative anemia due to acute blood loss   . Hypokalemia   . Acute psychosis   . Glaucoma   . Chronic depression   . Bilateral edema of lower extremity      CURRENT MEDICATIONS: Reviewed  Patient's Medications  New Prescriptions   No medications on file  Previous Medications   ACETAMINOPHEN (TYLENOL) 500 MG TABLET    Take 1,000  mg by mouth 2 (two) times daily.   FERROUS SULFATE 325 (65 FE) MG TABLET    Take 325 mg by mouth 2 (two) times daily with a meal.   FUROSEMIDE (LASIX) 20 MG TABLET    Take 1 tablet (20 mg total) by mouth daily.   HYDROCODONE-ACETAMINOPHEN (NORCO) 7.5-325 MG TABLET    Take 1 tablet by mouth every 8 (eight) hours as needed for moderate pain.   LISINOPRIL (PRINIVIL,ZESTRIL) 20 MG TABLET    Take 20 mg by mouth daily.   POTASSIUM CHLORIDE (K-DUR) 10 MEQ TABLET    Take 10 mEq by mouth. Give M-W-F   PROTEIN (PROCEL) POWD    Take 2 scoop by mouth 2 (two) times daily.   SACCHAROMYCES BOULARDII (FLORASTOR) 250 MG CAPSULE    Take 250 mg by mouth 2 (two) times daily. For ten (10) days   SERTRALINE (ZOLOFT) 50 MG TABLET    Take 50 mg by mouth daily.   SULFAMETHOXAZOLE-TRIMETHOPRIM (BACTRIM DS,SEPTRA DS) 800-160 MG TABLET    Take 1 tablet by mouth 2 (two) times daily. For seven (7) days  TIMOLOL (BETIMOL) 0.5 % OPHTHALMIC SOLUTION    Place 1 drop into the right eye daily.   WARFARIN (COUMADIN) 5 MG TABLET    Take 5 mg by mouth daily.  Modified Medications   No medications on file  Discontinued Medications   HYDROCODONE-ACETAMINOPHEN (NORCO) 7.5-325 MG TABLET    Take 1-2 tablets by mouth every 6 (six) hours as needed for moderate pain.   SODIUM POLYSTYRENE (KAYEXALATE) 15 GM/60ML SUSPENSION    Take 15 g by mouth once. Give x1.   WARFARIN (COUMADIN) 1 MG TABLET    Take 1 mg by mouth daily.     No Known Allergies   REVIEW OF SYSTEMS:  GENERAL: no change in appetite, no fatigue, no weight changes, no fever, chills or weakness EYES: Denies change in vision, dry eyes, eye pain, itching or discharge EARS: Denies change in hearing, ringing in ears, or earache NOSE: Denies nasal congestion or epistaxis MOUTH and THROAT: Denies oral discomfort, gingival pain or bleeding, pain from teeth or hoarseness   RESPIRATORY: no cough, SOB, DOE, wheezing, hemoptysis CARDIAC: no chest pain, edema or palpitations GI:  no abdominal pain, diarrhea, constipation, heart burn, nausea or vomiting GU: Denies dysuria, frequency, hematuria, incontinence, or discharge PSYCHIATRIC: Denies feeling of depression or anxiety. No report of hallucinations, insomnia, paranoia, or agitation   PHYSICAL EXAMINATION  GENERAL APPEARANCE: Well nourished. In no acute distress. Normal body habitus SKIN:  Right hip surgical incision is dry, no erythema HEAD: Normal in size and contour. No evidence of trauma EYES: Lids open and close normally. No blepharitis, entropion or ectropion. PERRL. Conjunctivae are clear and sclerae are white. Lenses are without opacity EARS: Pinnae are normal. Patient hears normal voice tunes of the examiner MOUTH and THROAT: Lips are without lesions. Oral mucosa is moist and without lesions. Tongue is normal in shape, size, and color and without lesions NECK: supple, trachea midline, no neck masses, no thyroid tenderness, no thyromegaly LYMPHATICS: no LAN in the neck, no supraclavicular LAN RESPIRATORY: breathing is even & unlabored, BS CTAB CARDIAC: RRR, no murmur,no extra heart sounds, RLE edema 1+, LLE trace edema GI: abdomen soft, normal BS, no masses, no tenderness, no hepatomegaly, no splenomegaly EXTREMITIES:  Able to move X 4 extremities; limited right hip ROM due to surgery PSYCHIATRIC: Alert and oriented X 3. Affect and behavior are appropriate  LABS/RADIOLOGY: Labs reviewed: Basic Metabolic Panel:  Recent Labs  96/10/5400/22/17 0759 09/14/15 0418 09/15/15 09/15/15 0616 09/24/15  NA 140 141 136* 136 136*  K 3.2* 3.9  --  3.7 6.0*  CL 102 105  --  103  --   CO2 26 25  --  26  --   GLUCOSE 106* 114*  --  115*  --   BUN 17 21* 28* 28* 39*  CREATININE 0.70 0.82 0.9 0.90 1.0  CALCIUM 8.5* 8.2*  --  8.2*  --    Liver Function Tests:  Recent Labs  09/24/15  AST 16  ALT 5*  ALKPHOS 110   CBC:  Recent Labs  09/11/15 0515  09/13/15 0759 09/14/15 0418 09/15/15 09/15/15 0616 09/24/15   WBC 3.6*  < > 3.9* 4.5 5.0 5.0 5.0  NEUTROABS 1.9  --   --   --   --   --   --   HGB 11.6*  < > 12.0* 10.4*  --  10.4* 8.7*  HCT 35.2*  < > 36.5* 31.9*  --  31.4* 26*  MCV 93.1  < > 92.4 91.1  --  92.1  --   PLT 126*  < > 98* 103*  --  99* 275  < > = values in this interval not displayed.   Dg Chest 1 View  09/11/2015  CLINICAL DATA:  Status post fall, with concern for chest injury. Initial encounter. EXAM: CHEST 1 VIEW COMPARISON:  None. FINDINGS: The lungs are well-aerated. A small right pleural effusion is noted. The left costophrenic angle is incompletely imaged on this study. Vascular congestion is seen, with increased interstitial markings, concerning for mild pulmonary edema. No pneumothorax is seen. The cardiomediastinal silhouette is enlarged. A pacemaker is noted, with a single lead ending at the right ventricle. No acute osseous abnormalities are seen. IMPRESSION: Small right pleural effusion. Vascular congestion and cardiomegaly. Increased interstitial markings raise concern for mild pulmonary edema. Electronically Signed   By: Roanna Raider M.D.   On: 09/11/2015 06:06   Ct Head Wo Contrast  09/11/2015  CLINICAL DATA:  Status post fall, hitting back of head. Patient on Coumadin. Headache. Initial encounter. EXAM: CT HEAD WITHOUT CONTRAST TECHNIQUE: Contiguous axial images were obtained from the base of the skull through the vertex without intravenous contrast. COMPARISON:  None. FINDINGS: There is no evidence of acute infarction, mass lesion, or intra- or extra-axial hemorrhage on CT. Prominence of the ventricles and sulci reflects mild to moderate cortical volume loss. Diffuse periventricular and subcortical white matter change likely reflects small vessel ischemic microangiopathy. Cerebellar atrophy is noted. Mild chronic ischemic change is noted at the right basal ganglia. The brainstem and fourth ventricle are within normal limits. The cerebral hemispheres demonstrate grossly normal  gray-white differentiation. No mass effect or midline shift is seen. There is no evidence of fracture; visualized osseous structures are unremarkable in appearance. The orbits are within normal limits. Mucoperiosteal thickening is noted at the sphenoid sinus. The remaining paranasal sinuses and mastoid air cells are well-aerated. No significant soft tissue abnormalities are seen. IMPRESSION: 1. No evidence of traumatic intracranial injury or fracture. 2. Mild to moderate cortical volume and diffuse small vessel ischemic microangiopathy. 3. Mild chronic ischemic change at the right basal ganglia. 4. Mucoperiosteal thickening at the sphenoid sinus. Electronically Signed   By: Roanna Raider M.D.   On: 09/11/2015 07:07   Dg Knee Complete 4 Views Right  09/11/2015  CLINICAL DATA:  Status post fall, with right hip pain. Initial encounter. EXAM: RIGHT KNEE - COMPLETE 4+ VIEW COMPARISON:  None. FINDINGS: There is no evidence of fracture or dislocation. The patient's total knee arthroplasty is grossly unremarkable in appearance, without evidence of loosening. The joint spaces are grossly preserved. No significant degenerative change is seen; the patellofemoral joint is grossly unremarkable in appearance. No significant joint effusion is seen. Diffuse vascular calcifications are seen. IMPRESSION: 1. No evidence of fracture or dislocation. Total knee arthroplasty is grossly unremarkable. 2. Diffuse vascular calcifications seen. Electronically Signed   By: Roanna Raider M.D.   On: 09/11/2015 06:01   Dg Hip Operative Unilat With Pelvis Right  09/13/2015  CLINICAL DATA:  Right femoral fracture ORIF. EXAM: OPERATIVE RIGHT HIP (WITH PELVIS IF PERFORMED) TWO VIEWS TECHNIQUE: Fluoroscopic spot image(s) were submitted for interpretation post-operatively. COMPARISON:  09/11/2015 FINDINGS: Three intraoperative spot fluoroscopic images of the right hip are provided, 1 frontal and 2 lateral projections. Intertrochanteric femur  fracture has been reduced with apparent near anatomic alignment on these limited images. An antegrade intramedullary nail has been placed with 2 proximal screws traversing the fracture and terminating in the femoral head. IMPRESSION: Intraoperative  images during ORIF of intertrochanteric right femur fracture. Electronically Signed   By: Sebastian Ache M.D.   On: 09/13/2015 14:53   Dg Hip Unilat  With Pelvis 2-3 Views Right  09/11/2015  CLINICAL DATA:  Status post fall, with right hip pain. Initial encounter. EXAM: DG HIP (WITH OR WITHOUT PELVIS) 2-3V RIGHT COMPARISON:  None. FINDINGS: There is a mildly displaced right femoral intertrochanteric fracture, with mild medial angulation. No additional fractures are seen. The right femoral head remains seated at the acetabulum. Axial joint space narrowing is noted at the left hip. The sacroiliac joints are grossly unremarkable. Mild degenerative change is noted at the lower lumbar spine. Diffuse vascular calcifications are seen. Mild chronic deformity is noted at the right superior and inferior pubic rami. IMPRESSION: 1. Mildly displaced right femoral intertrochanteric fracture, with mild medial angulation. 2. Diffuse vascular calcifications seen. Electronically Signed   By: Roanna Raider M.D.   On: 09/11/2015 05:55    ASSESSMENT/PLAN:  Anemia, acute blood loss - hemoglobin 8.7; start for sulfate 325 mg 1 tab by mouth twice a day; CBC in 1 week  Hyperkalemia - K6.0; give Kayexalate 15 g/60 mL by mouth 1; decrease KCl 10 meq by mouth every morning-W-F; check BMP in a.m.  Protein calorie malnutrition, severe - albumin 2.8; start Procel 2 scoops by mouth twice a day    [facility], NP [employer] [phone]

## 2015-09-26 ENCOUNTER — Encounter (HOSPITAL_COMMUNITY): Payer: Self-pay | Admitting: Orthopaedic Surgery

## 2015-09-26 LAB — BASIC METABOLIC PANEL
BUN: 34 mg/dL — AB (ref 4–21)
Creatinine: 0.7 mg/dL (ref 0.6–1.3)
GLUCOSE: 113 mg/dL
Potassium: 4.2 mmol/L (ref 3.4–5.3)
Sodium: 138 mmol/L (ref 137–147)

## 2015-09-28 ENCOUNTER — Encounter: Payer: Self-pay | Admitting: Adult Health

## 2015-09-28 ENCOUNTER — Non-Acute Institutional Stay (SKILLED_NURSING_FACILITY): Payer: Medicare Other | Admitting: Adult Health

## 2015-09-28 DIAGNOSIS — N390 Urinary tract infection, site not specified: Secondary | ICD-10-CM

## 2015-09-28 NOTE — Progress Notes (Signed)
Patient ID: Timothy Joyce, male   DOB: 04-Jul-1926, 82 y.o.   MRN: [id number]    DATE:  09/28/15  MRN:  [id number]  BIRTHDAY: 1925/12/16  Facility:  Nursing Home Location:  [facility]  Nursing Home Room Number: 408-P  LEVEL OF CARE:  SNF (31)  Contact Information    Name Relation Home Work [place] Son [phone]         Code Status History    Date Active Date Inactive Code Status Order ID Comments User Context   09/11/2015  8:08 AM 09/15/2015  7:30 PM Full Code [id number]  Marcos Eke, PA-C ED    Advance Directive Documentation        Most Recent Value   Type of Advance Directive  Out of facility DNR (pink MOST or yellow form)   Pre-existing out of facility DNR order (yellow form or pink MOST form)     "MOST" Form in Place?         Chief Complaint  Patient presents with  . Acute Visit    UTI    HISTORY OF PRESENT ILLNESS:  This is an 82 year old male who had an episode of confusion and agitation. Urinalysis and CS done. Urine culture showed >= 100,000 CFU/ml E. Coli. No fever nor hematuria reported.  PAST MEDICAL HISTORY:  Past Medical History  Diagnosis Date  . Anemia   . Hypertension   . Heart disease   . Hearing loss   . Irregular heartbeat   . Stroke (HCC)   . Fracture of right hip (HCC)   . Presence of permanent cardiac pacemaker     Medtronic; placed October 2014  . Atrial fibrillation (HCC)   . Pain, acute due to trauma   . Pleural effusion   . Anticoagulated on Coumadin   . Aortic stenosis   . Postoperative anemia due to acute blood loss   . Hypokalemia   . Acute psychosis   . Glaucoma   . Chronic depression   . Bilateral edema of lower extremity      CURRENT MEDICATIONS: Reviewed  Patient's Medications  New Prescriptions   No medications on file  Previous Medications   ACETAMINOPHEN (TYLENOL) 500 MG TABLET    Take 1,000 mg by mouth 2 (two) times daily.   FERROUS SULFATE 325 (65 FE) MG TABLET    Take 325 mg by  mouth 2 (two) times daily with a meal.   FUROSEMIDE (LASIX) 20 MG TABLET    Take 1 tablet (20 mg total) by mouth daily.   HYDROCODONE-ACETAMINOPHEN (NORCO) 7.5-325 MG TABLET    Take 1 tablet by mouth every 8 (eight) hours as needed for moderate pain.   LISINOPRIL (PRINIVIL,ZESTRIL) 20 MG TABLET    Take 20 mg by mouth daily.   POTASSIUM CHLORIDE (K-DUR) 10 MEQ TABLET    Take 10 mEq by mouth. Give M-W-F   PROTEIN (PROCEL) POWD    Take 2 scoop by mouth 2 (two) times daily.   SACCHAROMYCES BOULARDII (FLORASTOR) 250 MG CAPSULE    Take 250 mg by mouth 2 (two) times daily. For ten (10) days   SERTRALINE (ZOLOFT) 50 MG TABLET    Take 50 mg by mouth daily.   SULFAMETHOXAZOLE-TRIMETHOPRIM (BACTRIM DS,SEPTRA DS) 800-160 MG TABLET    Take 1 tablet by mouth 2 (two) times daily. For seven (7) days   TIMOLOL (BETIMOL) 0.5 % OPHTHALMIC SOLUTION    Place 1 drop into the right eye daily.  WARFARIN (COUMADIN) 5 MG TABLET    Take 5 mg by mouth daily.  Modified Medications   No medications on file  Discontinued Medications   SODIUM POLYSTYRENE (KAYEXALATE) 15 GM/60ML SUSPENSION    Take 15 g by mouth once. Give x1.     No Known Allergies   REVIEW OF SYSTEMS:  GENERAL: no change in appetite, no fatigue, no weight changes, no fever, chills or weakness EYES: Denies change in vision, dry eyes, eye pain, itching or discharge EARS: Denies change in hearing, ringing in ears, or earache NOSE: Denies nasal congestion or epistaxis MOUTH and THROAT: Denies oral discomfort, gingival pain or bleeding, pain from teeth or hoarseness   RESPIRATORY: no cough, SOB, DOE, wheezing, hemoptysis CARDIAC: no chest pain, edema or palpitations GI: no abdominal pain, diarrhea, constipation, heart burn, nausea or vomiting GU: Denies dysuria, frequency, hematuria, incontinence, or discharge PSYCHIATRIC: Denies feeling of depression or anxiety. No report of hallucinations, insomnia, paranoia, or agitation   PHYSICAL  EXAMINATION  GENERAL APPEARANCE: Well nourished. In no acute distress. Normal body habitus SKIN:  Right hip surgical incision is dry, no erythema HEAD: Normal in size and contour. No evidence of trauma EYES: Lids open and close normally. No blepharitis, entropion or ectropion. PERRL. Conjunctivae are clear and sclerae are white. Lenses are without opacity EARS: Pinnae are normal. Patient hears normal voice tunes of the examiner MOUTH and THROAT: Lips are without lesions. Oral mucosa is moist and without lesions. Tongue is normal in shape, size, and color and without lesions NECK: supple, trachea midline, no neck masses, no thyroid tenderness, no thyromegaly LYMPHATICS: no LAN in the neck, no supraclavicular LAN RESPIRATORY: breathing is even & unlabored, BS CTAB CARDIAC: RRR, no murmur,no extra heart sounds, RLE edema 1+, LLE trace edema GI: abdomen soft, normal BS, no masses, no tenderness, no hepatomegaly, no splenomegaly EXTREMITIES:  Able to move X 4 extremities; limited right hip ROM due to surgery PSYCHIATRIC: Alert and oriented X 3. Affect and behavior are appropriate  LABS/RADIOLOGY: Labs reviewed: Basic Metabolic Panel:  Recent Labs  40/98/1102/22/17 0759 09/14/15 0418 09/15/15 09/15/15 0616 09/24/15  NA 140 141 136* 136 136*  K 3.2* 3.9  --  3.7 6.0*  CL 102 105  --  103  --   CO2 26 25  --  26  --   GLUCOSE 106* 114*  --  115*  --   BUN 17 21* 28* 28* 39*  CREATININE 0.70 0.82 0.9 0.90 1.0  CALCIUM 8.5* 8.2*  --  8.2*  --    Liver Function Tests:  Recent Labs  09/24/15  AST 16  ALT 5*  ALKPHOS 110   CBC:  Recent Labs  09/11/15 0515  09/13/15 0759 09/14/15 0418 09/15/15 09/15/15 0616 09/24/15  WBC 3.6*  < > 3.9* 4.5 5.0 5.0 5.0  NEUTROABS 1.9  --   --   --   --   --   --   HGB 11.6*  < > 12.0* 10.4*  --  10.4* 8.7*  HCT 35.2*  < > 36.5* 31.9*  --  31.4* 26*  MCV 93.1  < > 92.4 91.1  --  92.1  --   PLT 126*  < > 98* 103*  --  99* 275  < > = values in this  interval not displayed.   Dg Chest 1 View  09/11/2015  CLINICAL DATA:  Status post fall, with concern for chest injury. Initial encounter. EXAM: CHEST 1 VIEW COMPARISON:  None.  FINDINGS: The lungs are well-aerated. A small right pleural effusion is noted. The left costophrenic angle is incompletely imaged on this study. Vascular congestion is seen, with increased interstitial markings, concerning for mild pulmonary edema. No pneumothorax is seen. The cardiomediastinal silhouette is enlarged. A pacemaker is noted, with a single lead ending at the right ventricle. No acute osseous abnormalities are seen. IMPRESSION: Small right pleural effusion. Vascular congestion and cardiomegaly. Increased interstitial markings raise concern for mild pulmonary edema. Electronically Signed   By: Roanna Raider M.D.   On: 09/11/2015 06:06   Ct Head Wo Contrast  09/11/2015  CLINICAL DATA:  Status post fall, hitting back of head. Patient on Coumadin. Headache. Initial encounter. EXAM: CT HEAD WITHOUT CONTRAST TECHNIQUE: Contiguous axial images were obtained from the base of the skull through the vertex without intravenous contrast. COMPARISON:  None. FINDINGS: There is no evidence of acute infarction, mass lesion, or intra- or extra-axial hemorrhage on CT. Prominence of the ventricles and sulci reflects mild to moderate cortical volume loss. Diffuse periventricular and subcortical white matter change likely reflects small vessel ischemic microangiopathy. Cerebellar atrophy is noted. Mild chronic ischemic change is noted at the right basal ganglia. The brainstem and fourth ventricle are within normal limits. The cerebral hemispheres demonstrate grossly normal gray-white differentiation. No mass effect or midline shift is seen. There is no evidence of fracture; visualized osseous structures are unremarkable in appearance. The orbits are within normal limits. Mucoperiosteal thickening is noted at the sphenoid sinus. The remaining  paranasal sinuses and mastoid air cells are well-aerated. No significant soft tissue abnormalities are seen. IMPRESSION: 1. No evidence of traumatic intracranial injury or fracture. 2. Mild to moderate cortical volume and diffuse small vessel ischemic microangiopathy. 3. Mild chronic ischemic change at the right basal ganglia. 4. Mucoperiosteal thickening at the sphenoid sinus. Electronically Signed   By: Roanna Raider M.D.   On: 09/11/2015 07:07   Dg Knee Complete 4 Views Right  09/11/2015  CLINICAL DATA:  Status post fall, with right hip pain. Initial encounter. EXAM: RIGHT KNEE - COMPLETE 4+ VIEW COMPARISON:  None. FINDINGS: There is no evidence of fracture or dislocation. The patient's total knee arthroplasty is grossly unremarkable in appearance, without evidence of loosening. The joint spaces are grossly preserved. No significant degenerative change is seen; the patellofemoral joint is grossly unremarkable in appearance. No significant joint effusion is seen. Diffuse vascular calcifications are seen. IMPRESSION: 1. No evidence of fracture or dislocation. Total knee arthroplasty is grossly unremarkable. 2. Diffuse vascular calcifications seen. Electronically Signed   By: Roanna Raider M.D.   On: 09/11/2015 06:01   Dg Hip Operative Unilat With Pelvis Right  09/13/2015  CLINICAL DATA:  Right femoral fracture ORIF. EXAM: OPERATIVE RIGHT HIP (WITH PELVIS IF PERFORMED) TWO VIEWS TECHNIQUE: Fluoroscopic spot image(s) were submitted for interpretation post-operatively. COMPARISON:  09/11/2015 FINDINGS: Three intraoperative spot fluoroscopic images of the right hip are provided, 1 frontal and 2 lateral projections. Intertrochanteric femur fracture has been reduced with apparent near anatomic alignment on these limited images. An antegrade intramedullary nail has been placed with 2 proximal screws traversing the fracture and terminating in the femoral head. IMPRESSION: Intraoperative images during ORIF of  intertrochanteric right femur fracture. Electronically Signed   By: Sebastian Ache M.D.   On: 09/13/2015 14:53   Dg Hip Unilat  With Pelvis 2-3 Views Right  09/11/2015  CLINICAL DATA:  Status post fall, with right hip pain. Initial encounter. EXAM: DG HIP (WITH OR WITHOUT PELVIS) 2-3V  RIGHT COMPARISON:  None. FINDINGS: There is a mildly displaced right femoral intertrochanteric fracture, with mild medial angulation. No additional fractures are seen. The right femoral head remains seated at the acetabulum. Axial joint space narrowing is noted at the left hip. The sacroiliac joints are grossly unremarkable. Mild degenerative change is noted at the lower lumbar spine. Diffuse vascular calcifications are seen. Mild chronic deformity is noted at the right superior and inferior pubic rami. IMPRESSION: 1. Mildly displaced right femoral intertrochanteric fracture, with mild medial angulation. 2. Diffuse vascular calcifications seen. Electronically Signed   By: Roanna Raider M.D.   On: 09/11/2015 05:55    ASSESSMENT/PLAN:  UTI - start Bactrim DS 1 tab by mouth twice a day 7 days and florasthor 250 mg 1 capsule by mouth twice a day 10 days   [facility], NP [facility] [phone]

## 2015-10-02 LAB — CBC AND DIFFERENTIAL
HEMATOCRIT: 25 % — AB (ref 41–53)
Hemoglobin: 7.9 g/dL — AB (ref 13.5–17.5)
PLATELETS: 193 10*3/uL (ref 150–399)
WBC: 3.4 10*3/mL

## 2015-10-05 LAB — CBC AND DIFFERENTIAL
HEMATOCRIT: 26 % — AB (ref 41–53)
Hemoglobin: 8.1 g/dL — AB (ref 13.5–17.5)
Platelets: 174 10*3/uL (ref 150–399)
WBC: 3.4 10^3/mL

## 2015-10-09 ENCOUNTER — Non-Acute Institutional Stay (SKILLED_NURSING_FACILITY): Payer: Medicare Other | Admitting: Adult Health

## 2015-10-09 ENCOUNTER — Encounter: Payer: Self-pay | Admitting: Adult Health

## 2015-10-09 DIAGNOSIS — R600 Localized edema: Secondary | ICD-10-CM | POA: Diagnosis not present

## 2015-10-09 DIAGNOSIS — Z7901 Long term (current) use of anticoagulants: Secondary | ICD-10-CM

## 2015-10-09 NOTE — Progress Notes (Addendum)
Patient ID: Timothy Joyce, male   DOB: 11/16/1925, 82 y.o.   MRN: [id number]    DATE:  10/09/15  MRN:  [id number]  BIRTHDAY: 11/05/1925  Facility:  Nursing Home Location:  [facility]  Nursing Home Room Number: 408-P  LEVEL OF CARE:  SNF (31)  Contact Information    Name Relation Home Work [employer]   [place],Jim Son [phone]         Code Status History    Date Active Date Inactive Code Status Order ID Comments User Context   09/11/2015  8:08 AM 09/15/2015  7:30 PM Full Code [id number]  Marcos EkeSara E Wertman, PA-C ED    Advance Directive Documentation        Most Recent Value   Type of Advance Directive  Out of facility DNR (pink MOST or yellow form)   Pre-existing out of facility DNR order (yellow form or pink MOST form)     "MOST" Form in Place?         Chief Complaint  Patient presents with  . Acute Visit    Bilateral LE edema, Coumadin therapy    HISTORY OF PRESENT ILLNESS:  This is an 82 year old male who was noted to have edema of BLE, 3+. Latest INR 1.8, subtherapeutic. He has Afib and had closed right hip fracture for which he had ORIF done on 09/13/15. He has been admitted for a short-term rehabilitation.   PAST MEDICAL HISTORY:  Past Medical History  Diagnosis Date  . Anemia   . Hypertension   . Heart disease   . Hearing loss   . Irregular heartbeat   . Stroke (HCC)   . Fracture of right hip (HCC)   . Presence of permanent cardiac pacemaker     Medtronic; placed October 2014  . Atrial fibrillation (HCC)   . Pain, acute due to trauma   . Pleural effusion   . Anticoagulated on Coumadin   . Aortic stenosis   . Postoperative anemia due to acute blood loss   . Hypokalemia   . Acute psychosis   . Glaucoma   . Chronic depression   . Bilateral edema of lower extremity   . Physical deconditioning   . Closed fracture of right hip with routine healing   . Current use of long term anticoagulation      CURRENT MEDICATIONS: Reviewed  Patient's  Medications  New Prescriptions   No medications on file  Previous Medications   ACETAMINOPHEN (TYLENOL) 500 MG TABLET    Take 1,000 mg by mouth 2 (two) times daily.       FERROUS SULFATE 325 (65 FE) MG TABLET    Take 325 mg by mouth 2 (two) times daily with a meal.   FUROSEMIDE (LASIX) 40 MG TABLET    Take 40 mg by mouth daily. Hold if SBP <110   HYDROCODONE-ACETAMINOPHEN (NORCO) 7.5-325 MG TABLET    Take 1 tablet by mouth every 8 (eight) hours as needed for moderate pain.   LISINOPRIL (PRINIVIL,ZESTRIL) 20 MG TABLET    Take 20 mg by mouth daily.   POTASSIUM CHLORIDE (K-DUR) 10 MEQ TABLET    Take 10 mEq by mouth. Give M-W-F   PROTEIN (PROCEL) POWD    Take 2 scoop by mouth 2 (two) times daily.       SERTRALINE (ZOLOFT) 50 MG TABLET    Take 50 mg by mouth daily.   TIMOLOL (BETIMOL) 0.5 % OPHTHALMIC SOLUTION    Place 1 drop into the right  eye daily.   WARFARIN (COUMADIN) 5 MG TABLET    Take 5 mg by mouth daily.  Modified Medications   No medications on file  Discontinued Medications   FUROSEMIDE (LASIX) 20 MG TABLET    Take 1 tablet (20 mg total) by mouth daily.   SACCHAROMYCES BOULARDII (FLORASTOR) 250 MG CAPSULE    Take 250 mg by mouth 2 (two) times daily. For ten (10) days   SULFAMETHOXAZOLE-TRIMETHOPRIM (BACTRIM DS,SEPTRA DS) 800-160 MG TABLET    Take 1 tablet by mouth 2 (two) times daily. For seven (7) days    No Known Allergies   REVIEW OF SYSTEMS:  GENERAL: no change in appetite, no fatigue, no weight changes, no fever, chills or weakness EYES: Denies change in vision, dry eyes, eye pain, itching or discharge EARS: Denies change in hearing, ringing in ears, or earache NOSE: Denies nasal congestion or epistaxis MOUTH and THROAT: Denies oral discomfort, gingival pain or bleeding, pain from teeth or hoarseness   RESPIRATORY: no cough, SOB, DOE, wheezing, hemoptysis CARDIAC: no chest pain, or palpitations, +edema GI: no abdominal pain, diarrhea, constipation, heart burn, nausea or  vomiting GU: Denies dysuria, frequency, hematuria, incontinence, or discharge PSYCHIATRIC: Denies feeling of depression or anxiety. No report of hallucinations, insomnia, paranoia, or agitation   PHYSICAL EXAMINATION  GENERAL APPEARANCE: Well nourished. In no acute distress. Normal body habitus SKIN:  Right hip surgical incision is dry, no erythema HEAD: Normal in size and contour. No evidence of trauma EYES: Lids open and close normally. No blepharitis, entropion or ectropion. PERRL. Conjunctivae are clear and sclerae are white. Lenses are without opacity EARS: Pinnae are normal. Patient hears normal voice tunes of the examiner MOUTH and THROAT: Lips are without lesions. Oral mucosa is moist and without lesions. Tongue is normal in shape, size, and color and without lesions NECK: supple, trachea midline, no neck masses, no thyroid tenderness, no thyromegaly LYMPHATICS: no LAN in the neck, no supraclavicular LAN RESPIRATORY: breathing is even & unlabored, BS CTAB CARDIAC: RRR, no murmur,no extra heart sounds, BLE edema 3+ GI: abdomen soft, normal BS, no masses, no tenderness, no hepatomegaly, no splenomegaly EXTREMITIES:  Able to move X 4 extremities; limited right hip ROM due to surgery PSYCHIATRIC: Alert and oriented X 3. Affect and behavior are appropriate  LABS/RADIOLOGY: Labs reviewed: Basic Metabolic Panel:  Recent Labs  16/10/96 0759 09/14/15 0418  09/15/15 0616 09/24/15 09/26/15  NA 140 141  < > 136 136* 138  K 3.2* 3.9  --  3.7 6.0* 4.2  CL 102 105  --  103  --   --   CO2 26 25  --  26  --   --   GLUCOSE 106* 114*  --  115*  --   --   BUN 17 21*  < > 28* 39* 34*  CREATININE 0.70 0.82  < > 0.90 1.0 0.7  CALCIUM 8.5* 8.2*  --  8.2*  --   --   < > = values in this interval not displayed. Liver Function Tests:  Recent Labs  09/24/15  AST 16  ALT 5*  ALKPHOS 110   CBC:  Recent Labs  09/11/15 0515  09/13/15 0759 09/14/15 0418  09/15/15 0616 09/24/15 10/02/15  10/05/15  WBC 3.6*  < > 3.9* 4.5  < > 5.0 5.0 3.4 3.4  NEUTROABS 1.9  --   --   --   --   --   --   --   --  HGB 11.6*  < > 12.0* 10.4*  --  10.4* 8.7* 7.9* 8.1*  HCT 35.2*  < > 36.5* 31.9*  --  31.4* 26* 25* 26*  MCV 93.1  < > 92.4 91.1  --  92.1  --   --   --   PLT 126*  < > 98* 103*  --  99* 275 193 174  < > = values in this interval not displayed.    ASSESSMENT/PLAN   BLE edema - increase Lasix to 40 mg daily; weigh Q Mondays and Wednesdays; BMP in 1 week  Long term use of anticoagulant - INR 1.8, suntherapeutic; Coumadin was held on 3/18 and 3/19 due to supatherapeutic INR; start Coumadin 5 mg PO daily and repeat INR on 10/12/15; check CBC   MEDINA-VARGAS,MONINA, NP [facility] [phone]

## 2015-10-10 ENCOUNTER — Encounter: Payer: Self-pay | Admitting: Adult Health

## 2015-10-10 LAB — BASIC METABOLIC PANEL
BUN: 24 mg/dL — AB (ref 4–21)
Creatinine: 0.8 mg/dL (ref 0.6–1.3)
Glucose: 85 mg/dL
POTASSIUM: 4.7 mmol/L (ref 3.4–5.3)
SODIUM: 138 mmol/L (ref 137–147)

## 2015-10-10 LAB — CBC AND DIFFERENTIAL
HEMATOCRIT: 31 % — AB (ref 41–53)
HEMOGLOBIN: 9.7 g/dL — AB (ref 13.5–17.5)
Neutrophils Absolute: 1 /uL
Platelets: 142 10*3/uL — AB (ref 150–399)
WBC: 3.3 10^3/mL

## 2015-10-11 ENCOUNTER — Non-Acute Institutional Stay (SKILLED_NURSING_FACILITY): Payer: Medicare Other | Admitting: Adult Health

## 2015-10-11 ENCOUNTER — Encounter: Payer: Self-pay | Admitting: Adult Health

## 2015-10-11 DIAGNOSIS — J189 Pneumonia, unspecified organism: Secondary | ICD-10-CM

## 2015-10-11 NOTE — Progress Notes (Signed)
Patient ID: Timothy Joyce, male   DOB: [birth year], 82 y.o.   MRN: [id number] Patient ID: Timothy Joyce, male   DOB: Jun 03, 1926, 82 y.o.   MRN: [id number]    DATE:  10/11/15  MRN:  [id number]  BIRTHDAY: [birth year]  Facility:  Nursing Home Location:  [facility]  Nursing Home Room Number: 408-P  LEVEL OF CARE:  SNF (31)  Contact Information    Name Relation Home Work [place] Son [phone]         Code Status History    Date Active Date Inactive Code Status Order ID Comments User Context   09/11/2015  8:08 AM 09/15/2015  7:30 PM Full Code [id number]  Marcos Eke, PA-C ED    Advance Directive Documentation        Most Recent Value   Type of Advance Directive  Out of facility DNR (pink MOST or yellow form)   Pre-existing out of facility DNR order (yellow form or pink MOST form)     "MOST" Form in Place?         Chief Complaint  Patient presents with  . Acute Visit    HCAP    HISTORY OF PRESENT ILLNESS:  This is an 82 year old male who was had fever, 102 F an O2 sat decreased to 90%. Rales noted on left lower lung field. Chest x-ray done and result showed superimposed opacity within the left lower lung which may represent infectious process.    PAST MEDICAL HISTORY:  Past Medical History  Diagnosis Date  . Anemia   . Hypertension   . Heart disease   . Hearing loss   . Irregular heartbeat   . Stroke (HCC)   . Fracture of right hip (HCC)   . Presence of permanent cardiac pacemaker     Medtronic; placed October 2014  . Atrial fibrillation (HCC)   . Pain, acute due to trauma   . Pleural effusion   . Anticoagulated on Coumadin   . Aortic stenosis   . Postoperative anemia due to acute blood loss   . Hypokalemia   . Acute psychosis   . Glaucoma   . Chronic depression   . Bilateral edema of lower extremity   . Physical deconditioning   . Closed fracture of right hip with routine healing   . Current use of long term anticoagulation       CURRENT MEDICATIONS: Reviewed  Patient's Medications  New Prescriptions   No medications on file  Previous Medications   ACETAMINOPHEN (TYLENOL) 500 MG TABLET    Take 1,000 mg by mouth 2 (two) times daily.   DOXYCYCLINE (VIBRAMYCIN) 100 MG CAPSULE    Take 100 mg by mouth 2 (two) times daily. Take for 10 days   FERROUS SULFATE 325 (65 FE) MG TABLET    Take 325 mg by mouth 2 (two) times daily with a meal.   FUROSEMIDE (LASIX) 40 MG TABLET    Take 40 mg by mouth daily. Hold if SBP <110   HYDROCODONE-ACETAMINOPHEN (NORCO) 7.5-325 MG TABLET    Take 1 tablet by mouth every 8 (eight) hours as needed for moderate pain.   LISINOPRIL (PRINIVIL,ZESTRIL) 20 MG TABLET    Take 20 mg by mouth daily.   POTASSIUM CHLORIDE (K-DUR) 10 MEQ TABLET    Take 10 mEq by mouth. Give M-W-F   PROTEIN (PROCEL) POWD    Take 2 scoop by mouth 2 (two) times daily.   SACCHAROMYCES BOULARDII (FLORASTOR) 250 MG  CAPSULE    Take 250 mg by mouth 2 (two) times daily. Take for 13 days   SERTRALINE (ZOLOFT) 50 MG TABLET    Take 50 mg by mouth daily.   TIMOLOL (BETIMOL) 0.5 % OPHTHALMIC SOLUTION    Place 1 drop into the right eye daily.   WARFARIN (COUMADIN) 5 MG TABLET    Take 5 mg by mouth daily.  Modified Medications   No medications on file  Discontinued Medications   No medications on file     No Known Allergies   REVIEW OF SYSTEMS:  GENERAL: no change in appetite, no fatigue, no weight changes, no fever, chills or weakness EYES: Denies change in vision, dry eyes, eye pain, itching or discharge EARS: Denies change in hearing, ringing in ears, or earache NOSE: Denies nasal congestion or epistaxis MOUTH and THROAT: Denies oral discomfort, gingival pain or bleeding, pain from teeth or hoarseness   RESPIRATORY: no cough, SOB, DOE, wheezing, hemoptysis CARDIAC: no chest pain, or palpitations, +edema GI: no abdominal pain, diarrhea, constipation, heart burn, nausea or vomiting GU: Denies dysuria, frequency,  hematuria, incontinence, or discharge PSYCHIATRIC: Denies feeling of depression or anxiety. No report of hallucinations, insomnia, paranoia, or agitation   PHYSICAL EXAMINATION  GENERAL APPEARANCE: Well nourished. In no acute distress. Normal body habitus SKIN:  Right hip surgical incision is healed HEAD: Normal in size and contour. No evidence of trauma EYES: Lids open and close normally. No blepharitis, entropion or ectropion. PERRL. Conjunctivae are clear and sclerae are white. Lenses are without opacity EARS: Pinnae are normal. Patient hears normal voice tunes of the examiner MOUTH and THROAT: Lips are without lesions. Oral mucosa is moist and without lesions. Tongue is normal in shape, size, and color and without lesions NECK: supple, trachea midline, no neck masses, no thyroid tenderness, no thyromegaly LYMPHATICS: no LAN in the neck, no supraclavicular LAN RESPIRATORY: breathing is even & unlabored, rales on left lower lung fields CARDIAC: RRR, no murmur,no extra heart sounds, BLE edema 2+ GI: abdomen soft, normal BS, no masses, no tenderness, no hepatomegaly, no splenomegaly EXTREMITIES:  Able to move X 4 extremities; limited right hip ROM due to surgery PSYCHIATRIC: Alert and oriented X 3. Affect and behavior are appropriate  LABS/RADIOLOGY: Labs reviewed: Basic Metabolic Panel:  Recent Labs  95/62/13 0759 09/14/15 0418  09/15/15 0616 09/24/15 09/26/15  NA 140 141  < > 136 136* 138  K 3.2* 3.9  --  3.7 6.0* 4.2  CL 102 105  --  103  --   --   CO2 26 25  --  26  --   --   GLUCOSE 106* 114*  --  115*  --   --   BUN 17 21*  < > 28* 39* 34*  CREATININE 0.70 0.82  < > 0.90 1.0 0.7  CALCIUM 8.5* 8.2*  --  8.2*  --   --   < > = values in this interval not displayed. Liver Function Tests:  Recent Labs  09/24/15  AST 16  ALT 5*  ALKPHOS 110   CBC:  Recent Labs  09/11/15 0515  09/13/15 0759 09/14/15 0418  09/15/15 0616 09/24/15 10/02/15 10/05/15  WBC 3.6*  < >  3.9* 4.5  < > 5.0 5.0 3.4 3.4  NEUTROABS 1.9  --   --   --   --   --   --   --   --   HGB 11.6*  < > 12.0* 10.4*  --  10.4* 8.7* 7.9* 8.1*  HCT 35.2*  < > 36.5* 31.9*  --  31.4* 26* 25* 26*  MCV 93.1  < > 92.4 91.1  --  92.1  --   --   --   PLT 126*  < > 98* 103*  --  99* 275 193 174  < > = values in this interval not displayed.    ASSESSMENT/PLAN   HCAP -  Start Doxycycline 100 mg 1 PO BID X 10 days and Florastor 250 mg PO BID X 13 days    [facility] Hospital[facility]-VARGAS,Yakov Bergen, NP [facility] [phone]

## 2015-10-12 ENCOUNTER — Encounter: Payer: Self-pay | Admitting: Adult Health

## 2015-10-12 ENCOUNTER — Non-Acute Institutional Stay (SKILLED_NURSING_FACILITY): Payer: Medicare Other | Admitting: Adult Health

## 2015-10-12 DIAGNOSIS — Z7901 Long term (current) use of anticoagulants: Secondary | ICD-10-CM

## 2015-10-12 DIAGNOSIS — S72001D Fracture of unspecified part of neck of right femur, subsequent encounter for closed fracture with routine healing: Secondary | ICD-10-CM

## 2015-10-12 NOTE — Progress Notes (Signed)
Patient ID: Timothy Joyce, male   DOB: 11/09/1925, 82 y.o.   MRN: [id number] Subjective:     Indication: DVT prophylaxis Bleeding signs/symptoms: None Thromboembolic signs/symptoms: None  Missed Coumadin doses: This week - 2 due to supratherapeutic INR Medication changes: yes - on antibiotic Dietary changes: no Bacterial/viral infection: yes Other concerns: no  The following portions of the patient's history were reviewed and updated as appropriate: allergies, current medications, past family history, past medical history, past social history, past surgical history and problem list.  Review of Systems A comprehensive review of systems was negative.   Objective:    INR Today: 1.6 Current dose: Coumadin 5 mg daily     Assessment:    Subtherapeutic INR for goal of 2-3   Plan:    1. New dose: Increase Coumadin to 5.5 mg daily   2. Next INR: 10/16/15

## 2015-10-16 LAB — CBC AND DIFFERENTIAL
HCT: 32 % — AB (ref 41–53)
HEMOGLOBIN: 9.8 g/dL — AB (ref 13.5–17.5)
Neutrophils Absolute: 1 /uL
PLATELETS: 133 10*3/uL — AB (ref 150–399)
WBC: 3.2 10^3/mL

## 2015-10-16 LAB — BASIC METABOLIC PANEL
BUN: 17 mg/dL (ref 4–21)
CREATININE: 0.6 mg/dL (ref 0.6–1.3)
Glucose: 87 mg/dL
POTASSIUM: 3.7 mmol/L (ref 3.4–5.3)
Sodium: 139 mmol/L (ref 137–147)

## 2015-10-19 ENCOUNTER — Encounter: Payer: Self-pay | Admitting: Adult Health

## 2015-10-19 ENCOUNTER — Non-Acute Institutional Stay (SKILLED_NURSING_FACILITY): Payer: Medicare Other | Admitting: Adult Health

## 2015-10-19 DIAGNOSIS — D62 Acute posthemorrhagic anemia: Secondary | ICD-10-CM | POA: Diagnosis not present

## 2015-10-19 DIAGNOSIS — E876 Hypokalemia: Secondary | ICD-10-CM | POA: Diagnosis not present

## 2015-10-19 DIAGNOSIS — J189 Pneumonia, unspecified organism: Secondary | ICD-10-CM

## 2015-10-19 DIAGNOSIS — R5381 Other malaise: Secondary | ICD-10-CM | POA: Diagnosis not present

## 2015-10-19 DIAGNOSIS — Z7901 Long term (current) use of anticoagulants: Secondary | ICD-10-CM | POA: Diagnosis not present

## 2015-10-19 DIAGNOSIS — E43 Unspecified severe protein-calorie malnutrition: Secondary | ICD-10-CM

## 2015-10-19 DIAGNOSIS — F329 Major depressive disorder, single episode, unspecified: Secondary | ICD-10-CM

## 2015-10-19 DIAGNOSIS — S72001D Fracture of unspecified part of neck of right femur, subsequent encounter for closed fracture with routine healing: Secondary | ICD-10-CM

## 2015-10-19 DIAGNOSIS — H409 Unspecified glaucoma: Secondary | ICD-10-CM | POA: Diagnosis not present

## 2015-10-19 DIAGNOSIS — I10 Essential (primary) hypertension: Secondary | ICD-10-CM

## 2015-10-19 DIAGNOSIS — F32A Depression, unspecified: Secondary | ICD-10-CM

## 2015-10-19 DIAGNOSIS — R600 Localized edema: Secondary | ICD-10-CM | POA: Diagnosis not present

## 2015-10-19 LAB — CBC AND DIFFERENTIAL
HEMATOCRIT: 29 % — AB (ref 41–53)
HEMOGLOBIN: 9.6 g/dL — AB (ref 13.5–17.5)
PLATELETS: 184 10*3/uL (ref 150–399)
WBC: 3 10^3/mL

## 2015-10-19 LAB — BASIC METABOLIC PANEL
BUN: 15 mg/dL (ref 4–21)
Creatinine: 0.6 mg/dL (ref 0.6–1.3)
Glucose: 107 mg/dL
Potassium: 3.8 mmol/L (ref 3.4–5.3)
Sodium: 140 mmol/L (ref 137–147)

## 2015-10-19 NOTE — Progress Notes (Signed)
Patient ID: Timothy Joyce, male   DOB: 1926/04/07, 82 y.o.   MRN: [id number]    DATE:  10/19/15  MRN:  [id number]  BIRTHDAY: 1926-05-25  Facility:  Nursing Home Location:  [facility]  Nursing Home Room Number: 408-P  LEVEL OF CARE:  SNF (31)  Contact Information    Name Relation Home Work [place] Son [phone]         Code Status History    Date Active Date Inactive Code Status Order ID Comments User Context   09/11/2015  8:08 AM 09/15/2015  7:30 PM Full Code [id number]  Marcos Eke, PA-C ED    Advance Directive Documentation        Most Recent Value   Type of Advance Directive  Out of facility DNR (pink MOST or yellow form)   Pre-existing out of facility DNR order (yellow form or pink MOST form)     "MOST" Form in Place?         Chief Complaint  Patient presents with  . Medical Management of Chronic Issues    HISTORY OF PRESENT ILLNESS:  This is an 82 year old male who is being seen for a routine visit. He was started on Doxycycline last week for PNA. However, he was noted to continue coughing and congested. Bilateral lower extremities were noted to have 3+ edema. Son is at bedside and is requesting to have Unna boots applied. He reports using unna boots before with good results.    PAST MEDICAL HISTORY:  Past Medical History  Diagnosis Date  . Anemia   . Hypertension   . Heart disease   . Hearing loss   . Irregular heartbeat   . Stroke (HCC)   . Fracture of right hip (HCC)   . Presence of permanent cardiac pacemaker     Medtronic; placed October 2014  . Atrial fibrillation (HCC)   . Pain, acute due to trauma   . Pleural effusion   . Anticoagulated on Coumadin   . Aortic stenosis   . Postoperative anemia due to acute blood loss   . Hypokalemia   . Acute psychosis   . Glaucoma   . Chronic depression   . Bilateral edema of lower extremity   . Physical deconditioning   . Closed fracture of right hip with routine healing   .  Current use of long term anticoagulation   . CAP (community acquired pneumonia)      CURRENT MEDICATIONS: Reviewed  Patient's Medications  New Prescriptions   No medications on file  Previous Medications   ACETAMINOPHEN (TYLENOL) 500 MG TABLET    Take 1,000 mg by mouth 2 (two) times daily.   FERROUS SULFATE 325 (65 FE) MG TABLET    Take 325 mg by mouth 2 (two) times daily with a meal.   FUROSEMIDE (LASIX) 40 MG TABLET    Take 40 mg by mouth daily. Hold if SBP <110   GUAIFENESIN (MUCINEX) 600 MG 12 HR TABLET    Take 600 mg by mouth 2 (two) times daily. For 14 days   HYDROCODONE-ACETAMINOPHEN (NORCO) 7.5-325 MG TABLET    Take 1 tablet by mouth every 8 (eight) hours as needed for moderate pain.   LISINOPRIL (PRINIVIL,ZESTRIL) 20 MG TABLET    Take 20 mg by mouth daily.   POTASSIUM CHLORIDE (K-DUR) 10 MEQ TABLET    Take 10 mEq by mouth. Give M-W-F   PROTEIN (PROCEL) POWD    Take 2 scoop by mouth  2 (two) times daily.   SERTRALINE (ZOLOFT) 50 MG TABLET    Take 50 mg by mouth daily.   TIMOLOL (BETIMOL) 0.5 % OPHTHALMIC SOLUTION    Place 1 drop into the right eye daily.  Modified Medications   No medications on file         MOXIFLOXACIN (AVELOX) 400 MG TABLET    Take 400 mg by mouth daily at 8 pm. For five (5) days   SACCHAROMYCES BOULARDII (FLORASTOR) 250 MG CAPSULE    Take 250 mg by mouth 2 (two) times daily. Take for 13 days            No Known Allergies   REVIEW OF SYSTEMS:  GENERAL: no change in appetite, no fatigue, no weight changes, no fever, chills or weakness EYES: Denies change in vision, dry eyes, eye pain, itching or discharge EARS: Denies change in hearing, ringing in ears, or earache NOSE: Denies nasal congestion or epistaxis MOUTH and THROAT: Denies oral discomfort, gingival pain or bleeding, pain from teeth or hoarseness   RESPIRATORY: no SOB, DOE, wheezing, hemoptysis, + cough CARDIAC: no chest pain, or palpitations, +edema GI: no abdominal pain, diarrhea,  constipation, heart burn, nausea or vomiting GU: Denies dysuria, frequency, hematuria, incontinence, or discharge PSYCHIATRIC: Denies feeling of depression or anxiety. No report of hallucinations, insomnia, paranoia, or agitation   PHYSICAL EXAMINATION  GENERAL APPEARANCE: Well nourished. In no acute distress. Normal body habitus SKIN:  Right hip surgical incision is healed HEAD: Normal in size and contour. No evidence of trauma EYES: Lids open and close normally. No blepharitis, entropion or ectropion. PERRL. Conjunctivae are clear and sclerae are white. Lenses are without opacity EARS: Pinnae are normal. Patient hears normal voice tunes of the examiner MOUTH and THROAT: Lips are without lesions. Oral mucosa is moist and without lesions. Tongue is normal in shape, size, and color and without lesions NECK: supple, trachea midline, no neck masses, no thyroid tenderness, no thyromegaly LYMPHATICS: no LAN in the neck, no supraclavicular LAN RESPIRATORY: breathing is even & unlabored, rales on left lower lung fields CARDIAC: RRR, no murmur,no extra heart sounds, BLE edema 3+ GI: abdomen soft, normal BS, no masses, no tenderness, no hepatomegaly, no splenomegaly EXTREMITIES:  Able to move X 4 extremities PSYCHIATRIC: Alert and oriented X 3. Affect and behavior are appropriate  LABS/RADIOLOGY: Labs reviewed: Basic Metabolic Panel:  Recent Labs  16/10/96 0759 09/14/15 0418  09/15/15 0616  10/10/15 10/16/15 10/19/15  NA 140 141  < > 136  < > 138 139 140  K 3.2* 3.9  --  3.7  < > 4.7 3.7 3.8  CL 102 105  --  103  --   --   --   --   CO2 26 25  --  26  --   --   --   --   GLUCOSE 106* 114*  --  115*  --   --   --   --   BUN 17 21*  < > 28*  < > 24* 17 15  CREATININE 0.70 0.82  < > 0.90  < > 0.8 0.6 0.6  CALCIUM 8.5* 8.2*  --  8.2*  --   --   --   --   < > = values in this interval not displayed. Liver Function Tests:  Recent Labs  09/24/15  AST 16  ALT 5*  ALKPHOS 110    CBC:  Recent Labs  09/11/15 0515  09/13/15 0759 09/14/15 0418  09/15/15 0616  10/10/15 10/16/15 10/19/15  WBC 3.6*  < > 3.9* 4.5  < > 5.0  < > 3.3 3.2 3.0  NEUTROABS 1.9  --   --   --   --   --   --  1 1  --   HGB 11.6*  < > 12.0* 10.4*  --  10.4*  < > 9.7* 9.8* 9.6*  HCT 35.2*  < > 36.5* 31.9*  --  31.4*  < > 31* 32* 29*  MCV 93.1  < > 92.4 91.1  --  92.1  --   --   --   --   PLT 126*  < > 98* 103*  --  99*  < > 142* 133* 184  < > = values in this interval not displayed.    ASSESSMENT/PLAN   Physical deconditioning -  continue rehabilitation  Right hip fracture S/P ORIF - follow-up with orthopedics; continue rehabilitation; Norco 7.5/325 mg 1 tab by mouth every 8 hours PRN and acetaminophen 500 mg 2 tabs by mouth twice a day for pain  Hypertension - continue lisinopril 20 mg 1 tab by mouth daily  HCAP -  Discontinue Doxycycline and start Avelox 400 mg PO daily X 5 days and Mucinex 600 mg Q 12 hours X 14 days  Long-term use of anticoagulant - INR 3.1; hold Coumadin and repeat INR on 10/20/15  BLE edema - apply unna boots to BLE and change Q M-W-F; continue Lasix 40 mg daily  Hypokalemia - continue KCl 10 meq  by mouth every Mondays-Wednesdays-Fridays  Anemia, acute blood loss - continue ferrous sulfate 325 mg 1 tab by mouth twice a day  Protein calorie malnutrition - continue Procel 2 scoops by mouth twice a day   Depression - mood this is stable; continue Zoloft 50 mg 1 tab by mouth daily  Glaucoma - continue Timolol  eyedrops      Goals of care:  Short-term rehabilitation     [facility] Medical CenterMEDINA-VARGAS,MONINA, NP [facility] [phone]

## 2015-10-23 ENCOUNTER — Ambulatory Visit (INDEPENDENT_AMBULATORY_CARE_PROVIDER_SITE_OTHER): Payer: Medicare Other | Admitting: Internal Medicine

## 2015-10-23 ENCOUNTER — Encounter: Payer: Self-pay | Admitting: Internal Medicine

## 2015-10-23 VITALS — BP 90/60 | HR 72

## 2015-10-23 DIAGNOSIS — R195 Other fecal abnormalities: Secondary | ICD-10-CM | POA: Diagnosis not present

## 2015-10-23 DIAGNOSIS — D62 Acute posthemorrhagic anemia: Secondary | ICD-10-CM

## 2015-10-23 NOTE — Patient Instructions (Signed)
Please have your CBC and iron studies followed closely (every 2 weeks until improvement and then routine per facility). If overt bleeding, we will readdress.  Replace iron if necessary.  Due to co-morbidities and advanced age, we will hold off on procedures for now.

## 2015-10-23 NOTE — Progress Notes (Signed)
Patient ID: Timothy Joyce, male   DOB: 01-01-1926, 82 y.o.   MRN: [id number] HPI: Timothy Joyce is an 82 year old male with history of A. fib on warfarin, aortic stenosis, pacemaker in place, history of bilateral lymphedema who is seen in consultation at the request of Dr. Glade Lloyd [facility]) to evaluate anemia and heme positive stool. He is here today with his son. His son is unaware of the reason for consultation. He comes with multiple records which I have reviewed. Based on history and review of records he fell in February and broke his right hip. This required ORIF and then transfer to acute rehabilitation where he has been living. Reportedly baseline hemoglobin around 12 but he became more anemic after surgery. Hemoccult was tested and there is documentation that on 10/05/2015 stool #2 was heme positive. Hemoglobin has been monitored with hemoglobin nadir of 7.9 on 10/02/2015. On 10/05/2015 hemoglobin improved 8.1 and improved further to 9.7 on 10/10/2015. Hemoglobin 9.8 on 10/16/2015. Iron studies were performed on 09/24/2015 and this revealed normal ferritin at 213, percent sat 31, TIBC slightly low at 220, iron normal at 68.  Both the patient and his son deny history of melena or rectal bleeding. He denies abdominal pain. He has felt bulging near the umbilicus without pain. He notices this when sitting. He denies nausea or vomiting. Denies heartburn. Denies dysphagia or odynophagia. He recalls 2 prior colonoscopies and denies a history of colon polyps. His last colonoscopy was greater than 10 or 15 years ago.  He does take warfarin since 1990 for atrial fibrillation.  According to the records he's been treated for UTI and also associated acute psychosis. For his chronic lymphedema he has recently had Unna boots placed.    Past Medical History  Diagnosis Date  . Anemia   . Hypertension   . Heart disease   . Hearing loss   . Irregular heartbeat   . Stroke (HCC)   . Fracture of right hip (HCC)    . Presence of permanent cardiac pacemaker     Medtronic; placed October 2014  . Atrial fibrillation (HCC)   . Pain, acute due to trauma   . Pleural effusion   . Anticoagulated on Coumadin   . Aortic stenosis   . Postoperative anemia due to acute blood loss   . Hypokalemia   . Acute psychosis   . Glaucoma   . Chronic depression   . Bilateral edema of lower extremity   . Physical deconditioning   . Closed fracture of right hip with routine healing   . Current use of long term anticoagulation     Past Surgical History  Procedure Laterality Date  . Joint replacement    . Right and left knee replacements    . Insert / replace / remove pacemaker  04/2013    Medtronic  . Intramedullary (im) nail intertrochanteric Right 09/13/2015    Procedure: INTRAMEDULLARY (IM) NAIL RIGHT HIP INTERTROCHANTERIC;  Surgeon: Tarry Kos, MD;  Location: [facility] OR;  Service: Orthopedics;  Laterality: Right;    Outpatient Prescriptions Prior to Visit  Medication Sig Dispense Refill  . acetaminophen (TYLENOL) 500 MG tablet Take 1,000 mg by mouth 2 (two) times daily.    . ferrous sulfate 325 (65 FE) MG tablet Take 325 mg by mouth 2 (two) times daily with a meal.    . furosemide (LASIX) 40 MG tablet Take 40 mg by mouth daily. Hold if SBP <110    . guaiFENesin (MUCINEX) 600 MG 12 hr  tablet Take 600 mg by mouth 2 (two) times daily. For 14 days    . HYDROcodone-acetaminophen (NORCO) 7.5-325 MG tablet Take 1 tablet by mouth every 8 (eight) hours as needed for moderate pain.    Marland Kitchen. lisinopril (PRINIVIL,ZESTRIL) 20 MG tablet Take 20 mg by mouth daily.    Marland Kitchen. moxifloxacin (AVELOX) 400 MG tablet Take 400 mg by mouth daily at 8 pm. For five (5) days    . potassium chloride (K-DUR) 10 MEQ tablet Take 10 mEq by mouth. Give M-W-F    . Protein (PROCEL) POWD Take 2 scoop by mouth 2 (two) times daily.    Marland Kitchen. saccharomyces boulardii (FLORASTOR) 250 MG capsule Take 250 mg by mouth 2 (two) times daily. Take for 13 days    .  sertraline (ZOLOFT) 50 MG tablet Take 50 mg by mouth daily.    . timolol (BETIMOL) 0.5 % ophthalmic solution Place 1 drop into the right eye daily.    . Warfarin Sodium (COUMADIN PO) Take 5.5 mg by mouth daily. Take 2.5 mg tablet, along with a 3 mg tablet to = 5.5 mg QD     No facility-administered medications prior to visit.    No Known Allergies  Family History  Problem Relation Age of Onset  . Family history unknown: Yes    Social History  Substance Use Topics  . Smoking status: Former Games developermoker  . Smokeless tobacco: Never Used  . Alcohol Use: No    ROS: As per history of present illness, otherwise negative  BP 90/60 mmHg  Pulse 72  Ht   Wt  Constitutional: Elderly appearing male in no acute distress sitting in wheelchair  HEENT: Normocephalic and atraumatic. Oropharynx is clear and moist. No oropharyngeal exudate. Conjunctivae are normal.  No scleral icterus. Neck: Neck supple. Trachea midline. Cardiovascular: Irregular rhythm 2/6 systolic ejection murmur Pulmonary/chest: Effort normal with bilateral rhonchi Abdominal: Soft, nontender, nondistended. Bowel sounds active throughout. Reducible nontender umbilical hernia Extremities: no clubbing, cyanosis, bilateral wrapping to just below the knee, trace to 1+ edema above the wrap, right upper leg more swollen than left Neurological: Alert and oriented to person place and time. Hard of hearing Skin: Skin is warm and dry.  Psychiatric: Normal mood and affect. Behavior is normal.  RELEVANT LABS AND IMAGING: CBC    Component Value Date/Time   WBC 3.0 10/19/2015   WBC 5.0 09/15/2015 0616   RBC 3.41* 09/15/2015 0616   HGB 9.6* 10/19/2015   HCT 29* 10/19/2015   PLT 184 10/19/2015   MCV 92.1 09/15/2015 0616   MCH 30.5 09/15/2015 0616   MCHC 33.1 09/15/2015 0616   RDW 15.2 09/15/2015 0616   LYMPHSABS 1.3 09/11/2015 0515   MONOABS 0.2 09/11/2015 0515   EOSABS 0.1 09/11/2015 0515   BASOSABS 0.0 09/11/2015 0515    CMP      Component Value Date/Time   NA 140 10/19/2015   NA 136 09/15/2015 0616   K 3.8 10/19/2015   CL 103 09/15/2015 0616   CO2 26 09/15/2015 0616   GLUCOSE 115* 09/15/2015 0616   BUN 15 10/19/2015   BUN 28* 09/15/2015 0616   CREATININE 0.6 10/19/2015   CREATININE 0.90 09/15/2015 0616   CALCIUM 8.2* 09/15/2015 0616   AST 16 09/24/2015   ALT 5* 09/24/2015   ALKPHOS 110 09/24/2015   GFRNONAA >60 09/15/2015 0616   GFRAA >60 09/15/2015 [id number]    ASSESSMENT/PLAN:  82 year old male with history of A. fib on warfarin, aortic stenosis, pacemaker in place, history  of bilateral lymphedema who is seen in consultation at the request of Dr. Glade Lloyd [facility]) to evaluate anemia and heme positive stool.  1. Acute anemia/heme + stools/chronic anticoagulation -- it does not appear that he has had overt bleeding. He did have one heme-positive stool by nursing record. The acute anemia is not iron deficient, in fact iron studies were normal. The anemia is likely in part if not completely related to hip fracture and subsequent surgery. His hemoglobin has improved over the month of March 2017. Given his medical comorbidities, including aortic stenosis and chronic anticoagulation along with advanced age, I do not think upper endoscopy or colonoscopy is necessary at present. I discussed the risks, benefits and alternatives with the patient and his son and they specifically agree that they would not want procedures at present. Ts decision may change if there is overt bleeding or worsening anemia. I recommend that his hemoglobin and iron studies be followed every 2 weeks until there is a definite trend towards normalization. He may require iron supplementation and oral iron should be added if ferritin or percent sat drops below normal. If there is evidence for overt bleeding and coagulation should be held and he should be reevaluated in GI.  Cc:Dr. Glade Lloyd -- [facility]

## 2015-10-26 ENCOUNTER — Non-Acute Institutional Stay (SKILLED_NURSING_FACILITY): Payer: Medicare Other | Admitting: Adult Health

## 2015-10-26 ENCOUNTER — Encounter: Payer: Self-pay | Admitting: Adult Health

## 2015-10-26 DIAGNOSIS — Z7901 Long term (current) use of anticoagulants: Secondary | ICD-10-CM

## 2015-10-26 DIAGNOSIS — S72001D Fracture of unspecified part of neck of right femur, subsequent encounter for closed fracture with routine healing: Secondary | ICD-10-CM | POA: Diagnosis not present

## 2015-10-26 LAB — CBC AND DIFFERENTIAL
HCT: 29 % — AB (ref 41–53)
Hemoglobin: 9.1 g/dL — AB (ref 13.5–17.5)
Neutrophils Absolute: 1 /uL
Platelets: 179 10*3/uL (ref 150–399)
WBC: 3.4 10*3/mL

## 2015-10-26 NOTE — Progress Notes (Signed)
Patient ID: Timothy Joyce, male   DOB: 12/12/1925, 82 y.o.   MRN: [id number] Subjective:     Indication: DVT prophylaxis S/P ORIF of right hip fracture Bleeding signs/symptoms: None Thromboembolic signs/symptoms: None  Missed Coumadin doses: This week - 3 due to supratherapeutic level Medication changes: no Dietary changes: no Bacterial/viral infection: no Other concerns: no  The following portions of the patient's history were reviewed and updated as appropriate: allergies, current medications, past family history, past medical history, past social history, past surgical history and problem list.  Review of Systems A comprehensive review of systems was negative.   Objective:    INR Today: 2.4 Current dose: Coumadin 5 mg daily     Assessment:    Therapeutic INR for goal of 2-3   Plan:    1. New dose: decrease Coumadin to 4 mg daily   2. Next INR: 10/31/15

## 2015-11-07 ENCOUNTER — Non-Acute Institutional Stay (SKILLED_NURSING_FACILITY): Payer: Medicare Other | Admitting: Adult Health

## 2015-11-07 ENCOUNTER — Encounter: Payer: Self-pay | Admitting: Adult Health

## 2015-11-07 DIAGNOSIS — S72001D Fracture of unspecified part of neck of right femur, subsequent encounter for closed fracture with routine healing: Secondary | ICD-10-CM

## 2015-11-07 DIAGNOSIS — F329 Major depressive disorder, single episode, unspecified: Secondary | ICD-10-CM | POA: Diagnosis not present

## 2015-11-07 DIAGNOSIS — D62 Acute posthemorrhagic anemia: Secondary | ICD-10-CM | POA: Diagnosis not present

## 2015-11-07 DIAGNOSIS — H409 Unspecified glaucoma: Secondary | ICD-10-CM | POA: Diagnosis not present

## 2015-11-07 DIAGNOSIS — R600 Localized edema: Secondary | ICD-10-CM | POA: Diagnosis not present

## 2015-11-07 DIAGNOSIS — E876 Hypokalemia: Secondary | ICD-10-CM | POA: Diagnosis not present

## 2015-11-07 DIAGNOSIS — E43 Unspecified severe protein-calorie malnutrition: Secondary | ICD-10-CM

## 2015-11-07 DIAGNOSIS — R5381 Other malaise: Secondary | ICD-10-CM | POA: Diagnosis not present

## 2015-11-07 DIAGNOSIS — I482 Chronic atrial fibrillation, unspecified: Secondary | ICD-10-CM

## 2015-11-07 DIAGNOSIS — I10 Essential (primary) hypertension: Secondary | ICD-10-CM | POA: Diagnosis not present

## 2015-11-07 DIAGNOSIS — F32A Depression, unspecified: Secondary | ICD-10-CM

## 2015-11-07 NOTE — Progress Notes (Signed)
Patient ID: Timothy Joyce, male   DOB: 08/21/1925, 82 y.o.   MRN: [id number]    DATE:  11/07/15  MRN:  [id number]  BIRTHDAY: 06/06/1926  Facility:  Nursing Home Location:  [facility]  Nursing Home Room Number: 403-1  LEVEL OF CARE:  SNF (31)  Contact Information    Name Relation Home Work [place]   Masten,Jim Son [phone]         Code Status History    Date Active Date Inactive Code Status Order ID Comments User Context   09/11/2015  8:08 AM 09/15/2015  7:30 PM Full Code [id number]  Marcos EkeSara E Wertman, PA-C ED    Advance Directive Documentation        Most Recent Value   Type of Advance Directive  Out of facility DNR (pink MOST or yellow form)   Pre-existing out of facility DNR order (yellow form or pink MOST form)     "MOST" Form in Place?         Chief Complaint  Patient presents with  . Discharge Note    HISTORY OF PRESENT ILLNESS:  This is an 82 year old male who is being discharged to ALF with  OT, PT, CNA and ST provided through residential facility.   [facility]. He has PMH of atrial fibrillation, hypertension and aortic stenosis. He was recently hospitalized due to a closed hip fracture for which he had ORIF on 09/13/15.  Patient was admitted to this facility for short-term rehabilitation after the patient's recent hospitalization.  Patient has completed SNF rehabilitation and therapy has cleared the patient for discharge.    PAST MEDICAL HISTORY:  Past Medical History  Diagnosis Date  . Anemia   . Hypertension   . Heart disease   . Hearing loss   . Irregular heartbeat   . Stroke (HCC)   . Fracture of right hip (HCC)   . Presence of permanent cardiac pacemaker     Medtronic; placed October 2014  . Atrial fibrillation (HCC)   . Pain, acute due to trauma   . Pleural effusion   . Anticoagulated on Coumadin   . Aortic stenosis   . Postoperative anemia due to acute blood loss   .  Hypokalemia   . Acute psychosis   . Glaucoma   . Chronic depression   . Bilateral edema of lower extremity   . Physical deconditioning   . Closed fracture of right hip with routine healing   . Current use of long term anticoagulation   . CAP (community acquired pneumonia)      CURRENT MEDICATIONS: Reviewed    Medication List       This list is accurate as of: 11/07/15  6:40 PM.  Always use your most recent med list.               acetaminophen 500 MG tablet  Commonly known as:  TYLENOL  Take 1,000 mg by mouth 2 (two) times daily.     ferrous sulfate 325 (65 FE) MG tablet  Take 325 mg by mouth 2 (two) times daily with a meal.     furosemide 40 MG tablet  Commonly known as:  LASIX  Take 40 mg by mouth daily. Hold if SBP <110     HYDROcodone-acetaminophen 7.5-325 MG tablet  Commonly known as:  NORCO  Take 1 tablet by mouth every 8 (eight) hours as needed for moderate pain.  lisinopril 20 MG tablet  Commonly known as:  PRINIVIL,ZESTRIL  Take 20 mg by mouth daily.     potassium chloride 10 MEQ tablet  Commonly known as:  K-DUR  Take 10 mEq by mouth. Give M-W-F     PROCEL Powd  Take 2 scoop by mouth 2 (two) times daily.     sertraline 50 MG tablet  Commonly known as:  ZOLOFT  Take 50 mg by mouth daily.     timolol 0.5 % ophthalmic solution  Commonly known as:  BETIMOL  Place 1 drop into the right eye daily.     warfarin 4 MG tablet  Commonly known as:  COUMADIN  Take 4 mg by mouth daily.        No Known Allergies   REVIEW OF SYSTEMS:  GENERAL: no change in appetite, no fatigue, no weight changes, no fever, chills or weakness EYES: Denies change in vision, dry eyes, eye pain, itching or discharge EARS: Denies change in hearing, ringing in ears, or earache NOSE: Denies nasal congestion or epistaxis MOUTH and THROAT: Denies oral discomfort, gingival pain or bleeding, pain from teeth or hoarseness   RESPIRATORY: no SOB, DOE, wheezing,  hemoptysis CARDIAC: no chest pain, or palpitations, +edema GI: no abdominal pain, diarrhea, constipation, heart burn, nausea or vomiting GU: Denies dysuria, frequency, hematuria, incontinence, or discharge PSYCHIATRIC: Denies feeling of depression or anxiety. No report of hallucinations, insomnia, paranoia, or agitation   PHYSICAL EXAMINATION  GENERAL APPEARANCE: Well nourished. In no acute distress. Normal body habitus SKIN:  Right hip surgical incision is healed HEAD: Normal in size and contour. No evidence of trauma EYES: Lids open and close normally. No blepharitis, entropion or ectropion. PERRL. Conjunctivae are clear and sclerae are white. Lenses are without opacity EARS: Pinnae are normal. Patient hears normal voice tunes of the examiner MOUTH and THROAT: Lips are without lesions. Oral mucosa is moist and without lesions. Tongue is normal in shape, size, and color and without lesions NECK: supple, trachea midline, no neck masses, no thyroid tenderness, no thyromegaly LYMPHATICS: no LAN in the neck, no supraclavicular LAN RESPIRATORY: breathing is even & unlabored, rales on left lower lung fields CARDIAC: RRR, no murmur,no extra heart sounds, BLE edema 2+, +pacemaker GI: abdomen soft, normal BS, no masses, no tenderness, no hepatomegaly, no splenomegaly EXTREMITIES:  Able to move X 4 extremities PSYCHIATRIC: Alert and oriented to person, disoriented to time and place. Affect and behavior are appropriate  LABS/RADIOLOGY: Labs reviewed: Basic Metabolic Panel:  Recent Labs  81/19/14 0759 09/14/15 0418  09/15/15 0616  10/10/15 10/16/15 10/19/15  NA 140 141  < > 136  < > 138 139 140  K 3.2* 3.9  --  3.7  < > 4.7 3.7 3.8  CL 102 105  --  103  --   --   --   --   CO2 26 25  --  26  --   --   --   --   GLUCOSE 106* 114*  --  115*  --   --   --   --   BUN 17 21*  < > 28*  < > 24* 17 15  CREATININE 0.70 0.82  < > 0.90  < > 0.8 0.6 0.6  CALCIUM 8.5* 8.2*  --  8.2*  --   --   --   --    < > = values in this interval not displayed. Liver Function Tests:  Recent Labs  09/24/15  AST  16  ALT 5*  ALKPHOS 110   CBC:  Recent Labs  09/13/15 0759 09/14/15 0418  09/15/15 0616  10/10/15 10/16/15 10/19/15 10/26/15  WBC 3.9* 4.5  < > 5.0  < > 3.3 3.2 3.0 3.4  NEUTROABS  --   --   --   --   --  1 1  --  1  HGB 12.0* 10.4*  --  10.4*  < > 9.7* 9.8* 9.6* 9.1*  HCT 36.5* 31.9*  --  31.4*  < > 31* 32* 29* 29*  MCV 92.4 91.1  --  92.1  --   --   --   --   --   PLT 98* 103*  --  99*  < > 142* 133* 184 179  < > = values in this interval not displayed.    ASSESSMENT/PLAN   Physical deconditioning -  ALF will provide OT, PT, ST and CNA   Right hip fracture S/P ORIF - follow-up with orthopedics; will have PT, OT and CNA; Norco 7.5/325 mg 1 tab by mouth every 8 hours PRN and acetaminophen 500 mg 2 tabs by mouth twice a day for pain  Hypertension -  Well-controlled; continue lisinopril 20 mg 1 tab by mouth daily  BLE edema - continue unna boots to BLE and change Q M-W-F; continue Lasix 40 mg daily  Hypokalemia - continue KCl 10 meq  by mouth every Mondays-Wednesdays-Fridays; K 3.8  Anemia, acute blood loss - continue ferrous sulfate 325 mg 1 tab by mouth twice a day; hgb 9.1  Protein calorie malnutrition - continue Procel 2 scoops by mouth twice a day   Depression - mood this is stable; continue Zoloft 50 mg 1 tab by mouth daily  Glaucoma - continue Timolol  eyedrops  Chronic atrial fibrillation - rate-controlled ; continue Coumadin 4 mg daily; check INR on 11/10/15    I have filled out patient's discharge paperwork and written prescriptions.  Patient will have  PT, OT, ST and CNA through ALF.  Total discharge time: Greater than 30 minutes  Discharge time involved coordination of the discharge process with social worker, nursing staff and therapy department. Medical justification for rehabilitation services verified.     [facility], NP [employer] [phone]

## 2015-11-12 ENCOUNTER — Emergency Department (HOSPITAL_COMMUNITY): Payer: Medicare Other

## 2015-11-12 ENCOUNTER — Inpatient Hospital Stay (HOSPITAL_COMMUNITY)
Admission: EM | Admit: 2015-11-12 | Discharge: 2015-11-15 | DRG: 193 | Disposition: A | Discharge status: Skilled Nursing Facility | Payer: Medicare Other | Attending: Family Medicine | Admitting: Family Medicine

## 2015-11-12 ENCOUNTER — Encounter (HOSPITAL_COMMUNITY): Payer: Self-pay

## 2015-11-12 DIAGNOSIS — Z950 Presence of cardiac pacemaker: Secondary | ICD-10-CM | POA: Diagnosis not present

## 2015-11-12 DIAGNOSIS — H9190 Unspecified hearing loss, unspecified ear: Secondary | ICD-10-CM | POA: Diagnosis present

## 2015-11-12 DIAGNOSIS — R410 Disorientation, unspecified: Secondary | ICD-10-CM

## 2015-11-12 DIAGNOSIS — J189 Pneumonia, unspecified organism: Secondary | ICD-10-CM

## 2015-11-12 DIAGNOSIS — I890 Lymphedema, not elsewhere classified: Secondary | ICD-10-CM | POA: Diagnosis present

## 2015-11-12 DIAGNOSIS — I350 Nonrheumatic aortic (valve) stenosis: Secondary | ICD-10-CM

## 2015-11-12 DIAGNOSIS — D6959 Other secondary thrombocytopenia: Secondary | ICD-10-CM | POA: Diagnosis present

## 2015-11-12 DIAGNOSIS — I110 Hypertensive heart disease with heart failure: Secondary | ICD-10-CM | POA: Diagnosis present

## 2015-11-12 DIAGNOSIS — I482 Chronic atrial fibrillation: Secondary | ICD-10-CM | POA: Diagnosis not present

## 2015-11-12 DIAGNOSIS — I4892 Unspecified atrial flutter: Secondary | ICD-10-CM | POA: Diagnosis present

## 2015-11-12 DIAGNOSIS — D696 Thrombocytopenia, unspecified: Secondary | ICD-10-CM | POA: Diagnosis not present

## 2015-11-12 DIAGNOSIS — I2510 Atherosclerotic heart disease of native coronary artery without angina pectoris: Secondary | ICD-10-CM | POA: Diagnosis present

## 2015-11-12 DIAGNOSIS — Z8673 Personal history of transient ischemic attack (TIA), and cerebral infarction without residual deficits: Secondary | ICD-10-CM | POA: Diagnosis not present

## 2015-11-12 DIAGNOSIS — G9340 Encephalopathy, unspecified: Secondary | ICD-10-CM | POA: Diagnosis present

## 2015-11-12 DIAGNOSIS — Z66 Do not resuscitate: Secondary | ICD-10-CM | POA: Diagnosis present

## 2015-11-12 DIAGNOSIS — F329 Major depressive disorder, single episode, unspecified: Secondary | ICD-10-CM | POA: Diagnosis present

## 2015-11-12 DIAGNOSIS — I4891 Unspecified atrial fibrillation: Secondary | ICD-10-CM | POA: Diagnosis present

## 2015-11-12 DIAGNOSIS — K429 Umbilical hernia without obstruction or gangrene: Secondary | ICD-10-CM | POA: Diagnosis present

## 2015-11-12 DIAGNOSIS — R4182 Altered mental status, unspecified: Secondary | ICD-10-CM | POA: Diagnosis present

## 2015-11-12 DIAGNOSIS — Y95 Nosocomial condition: Secondary | ICD-10-CM | POA: Diagnosis present

## 2015-11-12 DIAGNOSIS — Z87891 Personal history of nicotine dependence: Secondary | ICD-10-CM

## 2015-11-12 DIAGNOSIS — H409 Unspecified glaucoma: Secondary | ICD-10-CM | POA: Diagnosis present

## 2015-11-12 DIAGNOSIS — D649 Anemia, unspecified: Secondary | ICD-10-CM | POA: Diagnosis present

## 2015-11-12 DIAGNOSIS — Z7901 Long term (current) use of anticoagulants: Secondary | ICD-10-CM

## 2015-11-12 DIAGNOSIS — I272 Other secondary pulmonary hypertension: Secondary | ICD-10-CM | POA: Diagnosis present

## 2015-11-12 DIAGNOSIS — I5032 Chronic diastolic (congestive) heart failure: Secondary | ICD-10-CM | POA: Diagnosis present

## 2015-11-12 LAB — URINALYSIS, ROUTINE W REFLEX MICROSCOPIC
GLUCOSE, UA: NEGATIVE mg/dL
Ketones, ur: 15 mg/dL — AB
Leukocytes, UA: NEGATIVE
Nitrite: NEGATIVE
PH: 6.5 (ref 5.0–8.0)
Protein, ur: NEGATIVE mg/dL
SPECIFIC GRAVITY, URINE: 1.019 (ref 1.005–1.030)

## 2015-11-12 LAB — CBC WITH DIFFERENTIAL/PLATELET
BASOS ABS: 0 10*3/uL (ref 0.0–0.1)
BASOS PCT: 0 %
EOS ABS: 0.1 10*3/uL (ref 0.0–0.7)
Eosinophils Relative: 1 %
HCT: 31.3 % — ABNORMAL LOW (ref 39.0–52.0)
Hemoglobin: 10 g/dL — ABNORMAL LOW (ref 13.0–17.0)
Lymphocytes Relative: 28 %
Lymphs Abs: 1.5 10*3/uL (ref 0.7–4.0)
MCH: 30.1 pg (ref 26.0–34.0)
MCHC: 31.9 g/dL (ref 30.0–36.0)
MCV: 94.3 fL (ref 78.0–100.0)
MONO ABS: 0.5 10*3/uL (ref 0.1–1.0)
MONOS PCT: 10 %
NEUTROS ABS: 3.1 10*3/uL (ref 1.7–7.7)
NEUTROS PCT: 61 %
Platelets: 132 10*3/uL — ABNORMAL LOW (ref 150–400)
RBC: 3.32 MIL/uL — ABNORMAL LOW (ref 4.22–5.81)
RDW: 17 % — AB (ref 11.5–15.5)
WBC: 5.2 10*3/uL (ref 4.0–10.5)

## 2015-11-12 LAB — LIPASE, BLOOD: Lipase: 28 U/L (ref 11–51)

## 2015-11-12 LAB — I-STAT TROPONIN, ED: Troponin i, poc: 0.02 ng/mL (ref 0.00–0.08)

## 2015-11-12 LAB — BRAIN NATRIURETIC PEPTIDE: B NATRIURETIC PEPTIDE 5: 498.5 pg/mL — AB (ref 0.0–100.0)

## 2015-11-12 LAB — COMPREHENSIVE METABOLIC PANEL
ALBUMIN: 3.3 g/dL — AB (ref 3.5–5.0)
ALK PHOS: 103 U/L (ref 38–126)
ALT: 8 U/L — ABNORMAL LOW (ref 17–63)
ANION GAP: 14 (ref 5–15)
AST: 23 U/L (ref 15–41)
BILIRUBIN TOTAL: 2.6 mg/dL — AB (ref 0.3–1.2)
BUN: 13 mg/dL (ref 6–20)
CALCIUM: 8.9 mg/dL (ref 8.9–10.3)
CO2: 22 mmol/L (ref 22–32)
Chloride: 105 mmol/L (ref 101–111)
Creatinine, Ser: 0.79 mg/dL (ref 0.61–1.24)
GFR calc Af Amer: >60 mL/min (ref >60)
GLUCOSE: 82 mg/dL (ref 65–99)
POTASSIUM: 4.4 mmol/L (ref 3.5–5.1)
Sodium: 141 mmol/L (ref 135–145)
TOTAL PROTEIN: 7.3 g/dL (ref 6.5–8.1)

## 2015-11-12 LAB — URINE MICROSCOPIC-ADD ON: BACTERIA UA: NONE SEEN

## 2015-11-12 LAB — I-STAT CG4 LACTIC ACID, ED
LACTIC ACID, VENOUS: 1.86 mmol/L (ref 0.5–2.0)
LACTIC ACID, VENOUS: 1.26 mmol/L (ref 0.5–2.0)

## 2015-11-12 LAB — PROTIME-INR
INR: 1.37 (ref 0.00–1.49)
Prothrombin Time: 17 seconds — ABNORMAL HIGH (ref 11.6–15.2)

## 2015-11-12 LAB — GLUCOSE, CAPILLARY: GLUCOSE-CAPILLARY: 76 mg/dL (ref 65–99)

## 2015-11-12 MED ORDER — SODIUM CHLORIDE 0.9 % IV BOLUS (SEPSIS)
500.0000 mL | Freq: Once | INTRAVENOUS | Status: AC
  Administered 2015-11-12: 500 mL via INTRAVENOUS

## 2015-11-12 MED ORDER — TIMOLOL HEMIHYDRATE 0.5 % OP SOLN
1.0000 [drp] | Freq: Every day | OPHTHALMIC | Status: DC
  Administered 2015-11-13 – 2015-11-14 (×2): 1 [drp] via OPHTHALMIC
  Filled 2015-11-12: qty 5

## 2015-11-12 MED ORDER — ACETAMINOPHEN 500 MG PO TABS
1000.0000 mg | ORAL_TABLET | Freq: Two times a day (BID) | ORAL | Status: DC
  Administered 2015-11-12 – 2015-11-15 (×6): 1000 mg via ORAL
  Filled 2015-11-12 (×6): qty 2

## 2015-11-12 MED ORDER — WARFARIN SODIUM 2 MG PO TABS
4.0000 mg | ORAL_TABLET | Freq: Once | ORAL | Status: AC
  Administered 2015-11-12: 4 mg via ORAL
  Filled 2015-11-12: qty 2
  Filled 2015-11-12: qty 1

## 2015-11-12 MED ORDER — HYDROCODONE-ACETAMINOPHEN 7.5-325 MG PO TABS: 1.0000 | ORAL_TABLET | Freq: Three times a day (TID) | ORAL | Status: DC | PRN

## 2015-11-12 MED ORDER — PIPERACILLIN-TAZOBACTAM 3.375 G IVPB
3.3750 g | Freq: Three times a day (TID) | INTRAVENOUS | Status: DC
  Administered 2015-11-12 – 2015-11-15 (×8): 3.375 g via INTRAVENOUS
  Filled 2015-11-12 (×12): qty 50

## 2015-11-12 MED ORDER — SODIUM CHLORIDE 0.9 % IV SOLN
INTRAVENOUS | Status: DC
  Administered 2015-11-12: 15:00:00 via INTRAVENOUS

## 2015-11-12 MED ORDER — ACETAMINOPHEN 650 MG RE SUPP
650.0000 mg | Freq: Once | RECTAL | Status: AC
  Administered 2015-11-12: 650 mg via RECTAL
  Filled 2015-11-12: qty 1

## 2015-11-12 MED ORDER — FUROSEMIDE 40 MG PO TABS
40.0000 mg | ORAL_TABLET | Freq: Every day | ORAL | Status: DC
  Administered 2015-11-13 – 2015-11-15 (×3): 40 mg via ORAL
  Filled 2015-11-12 (×3): qty 1

## 2015-11-12 MED ORDER — PIPERACILLIN-TAZOBACTAM 3.375 G IVPB 30 MIN
3.3750 g | Freq: Once | INTRAVENOUS | Status: AC
  Administered 2015-11-12: 3.375 g via INTRAVENOUS
  Filled 2015-11-12: qty 50

## 2015-11-12 MED ORDER — VANCOMYCIN HCL IN DEXTROSE 1-5 GM/200ML-% IV SOLN: 1000.0000 mg | Freq: Once | INTRAVENOUS | Status: DC

## 2015-11-12 MED ORDER — WARFARIN - PHARMACIST DOSING INPATIENT
Freq: Every day | Status: DC
  Administered 2015-11-12 – 2015-11-13 (×2)

## 2015-11-12 MED ORDER — FERROUS SULFATE 325 (65 FE) MG PO TABS
325.0000 mg | ORAL_TABLET | Freq: Two times a day (BID) | ORAL | Status: DC
  Administered 2015-11-13 – 2015-11-15 (×5): 325 mg via ORAL
  Filled 2015-11-12 (×5): qty 1

## 2015-11-12 MED ORDER — SERTRALINE HCL 50 MG PO TABS
50.0000 mg | ORAL_TABLET | Freq: Every day | ORAL | Status: DC
  Administered 2015-11-13 – 2015-11-15 (×3): 50 mg via ORAL
  Filled 2015-11-12 (×3): qty 1

## 2015-11-12 MED ORDER — VANCOMYCIN HCL 10 G IV SOLR
1250.0000 mg | Freq: Two times a day (BID) | INTRAVENOUS | Status: DC
  Administered 2015-11-13 – 2015-11-14 (×3): 1250 mg via INTRAVENOUS
  Filled 2015-11-12 (×5): qty 1250

## 2015-11-12 MED ORDER — VANCOMYCIN HCL 10 G IV SOLR
2000.0000 mg | Freq: Once | INTRAVENOUS | Status: AC
  Administered 2015-11-12: 2000 mg via INTRAVENOUS
  Filled 2015-11-12: qty 2000

## 2015-11-12 MED ORDER — LISINOPRIL 20 MG PO TABS
20.0000 mg | ORAL_TABLET | Freq: Every day | ORAL | Status: DC
  Administered 2015-11-13 – 2015-11-15 (×3): 20 mg via ORAL
  Filled 2015-11-12 (×3): qty 1

## 2015-11-12 MED ORDER — PRO-STAT SUGAR FREE PO LIQD
30.0000 mL | Freq: Two times a day (BID) | ORAL | Status: DC
  Administered 2015-11-12 – 2015-11-15 (×6): 30 mL via ORAL
  Filled 2015-11-12 (×6): qty 30

## 2015-11-12 NOTE — ED Provider Notes (Signed)
CSN: [id number]     Arrival date & time 11/12/15  1302 History   First MD Initiated Contact with Patient 11/12/15 1325     Chief Complaint  Patient presents with  . Fever     (Consider location/radiation/quality/duration/timing/severity/associated sxs/prior Treatment) Patient is a 82 y.o. male presenting with fever. The history is provided by the patient, the EMS personnel and a relative. The history is limited by the condition of the patient.  Fever  Patient brought in by EMS coming by his son. Patient is currently at independent living at [facility]. Patient had a history of pneumonia back in March. Also was in rehabilitation for hip fracture. Patient last evening started with chills and getting of more confused. Son states last time he acted like this he had had a stroke. However he did not have fevers at that time. Past Medical History  Diagnosis Date  . Anemia   . Hypertension   . Heart disease   . Hearing loss   . Irregular heartbeat   . Stroke (HCC)   . Fracture of right hip (HCC)   . Presence of permanent cardiac pacemaker     Medtronic; placed October 2014  . Atrial fibrillation (HCC)   . Pain, acute due to trauma   . Pleural effusion   . Anticoagulated on Coumadin   . Aortic stenosis   . Postoperative anemia due to acute blood loss   . Hypokalemia   . Acute psychosis   . Glaucoma   . Chronic depression   . Bilateral edema of lower extremity   . Physical deconditioning   . Closed fracture of right hip with routine healing   . Current use of long term anticoagulation   . CAP (community acquired pneumonia)    Past Surgical History  Procedure Laterality Date  . Joint replacement    . Right and left knee replacements    . Insert / replace / remove pacemaker  04/2013    Medtronic  . Intramedullary (im) nail intertrochanteric Right 09/13/2015    Procedure: INTRAMEDULLARY (IM) NAIL RIGHT HIP INTERTROCHANTERIC;  Surgeon: Tarry Kos, MD;  Location: [facility] OR;   Service: Orthopedics;  Laterality: Right;   Family History  Problem Relation Age of Onset  . Family history unknown: Yes   Social History  Substance Use Topics  . Smoking status: Former Games developer  . Smokeless tobacco: Never Used  . Alcohol Use: No    Review of Systems  Unable to perform ROS: Mental status change  Constitutional: Positive for fever.      Allergies  Review of patient's allergies indicates no known allergies.  Home Medications   Prior to Admission medications   Medication Sig Start Date End Date Taking? Authorizing Provider  acetaminophen (TYLENOL) 500 MG tablet Take 1,000 mg by mouth 2 (two) times daily.    Historical Provider, MD  ferrous sulfate 325 (65 FE) MG tablet Take 325 mg by mouth 2 (two) times daily with a meal.    Historical Provider, MD  furosemide (LASIX) 40 MG tablet Take 40 mg by mouth daily. Hold if SBP <110    Historical Provider, MD  HYDROcodone-acetaminophen (NORCO) 7.5-325 MG tablet Take 1 tablet by mouth every 8 (eight) hours as needed for moderate pain.    Historical Provider, MD  lisinopril (PRINIVIL,ZESTRIL) 20 MG tablet Take 20 mg by mouth daily.    Historical Provider, MD  potassium chloride (K-DUR) 10 MEQ tablet Take 10 mEq by mouth. Give M-W-F  Historical Provider, MD  Protein (PROCEL) POWD Take 2 scoop by mouth 2 (two) times daily.    Historical Provider, MD  sertraline (ZOLOFT) 50 MG tablet Take 50 mg by mouth daily.    Historical Provider, MD  timolol (BETIMOL) 0.5 % ophthalmic solution Place 1 drop into the right eye daily.    Historical Provider, MD  warfarin (COUMADIN) 4 MG tablet Take 4 mg by mouth daily.    Historical Provider, MD   BP 154/92 mmHg  Pulse 249  Temp(Src) 100.9 F (38.3 C) (Rectal)  Resp 31  Wt 97.4 kg  SpO2 98% Physical Exam  Constitutional: He appears well-developed and well-nourished.  HENT:  Head: Normocephalic and atraumatic.  Mucous membranes very dry.  Eyes: Conjunctivae are normal. Pupils are  equal, round, and reactive to light.  Neck: Normal range of motion.  Pulmonary/Chest: Effort normal and breath sounds normal. He has no wheezes. He has no rales.  Abdominal: Soft. Bowel sounds are normal. There is no tenderness.  Neurological:  Patient will wake up. Very hard of hearing difficult to communicate with. Asians seems to be very sleepy.  Skin: Skin is warm. No rash noted.  Nursing note and vitals reviewed.   ED Course  Procedures (including critical care time) Labs Review Labs Reviewed  COMPREHENSIVE METABOLIC PANEL - Abnormal; Notable for the following:    Albumin 3.3 (*)    ALT 8 (*)    Total Bilirubin 2.6 (*)    All other components within normal limits  CBC WITH DIFFERENTIAL/PLATELET - Abnormal; Notable for the following:    RBC 3.32 (*)    Hemoglobin 10.0 (*)    HCT 31.3 (*)    RDW 17.0 (*)    Platelets 132 (*)    All other components within normal limits  URINALYSIS, ROUTINE W REFLEX MICROSCOPIC (NOT AT [facility]) - Abnormal; Notable for the following:    Color, Urine AMBER (*)    APPearance CLOUDY (*)    Hgb urine dipstick SMALL (*)    Bilirubin Urine SMALL (*)    Ketones, ur 15 (*)    All other components within normal limits  PROTIME-INR - Abnormal; Notable for the following:    Prothrombin Time 17.0 (*)    All other components within normal limits  BRAIN NATRIURETIC PEPTIDE - Abnormal; Notable for the following:    B Natriuretic Peptide 498.5 (*)    All other components within normal limits  URINE MICROSCOPIC-ADD ON - Abnormal; Notable for the following:    Squamous Epithelial / LPF 0-5 (*)    All other components within normal limits  CULTURE, BLOOD (ROUTINE X 2)  CULTURE, BLOOD (ROUTINE X 2)  URINE CULTURE  LIPASE, BLOOD  I-STAT CG4 LACTIC ACID, ED   Results for orders placed or performed during the hospital encounter of 11/12/15  Comprehensive metabolic panel  Result Value Ref Range   Sodium 141 135 - 145 mmol/L   Potassium 4.4 3.5 - 5.1 mmol/L    Chloride 105 101 - 111 mmol/L   CO2 22 22 - 32 mmol/L   Glucose, Bld 82 65 - 99 mg/dL   BUN 13 6 - 20 mg/dL   Creatinine, Ser 1.61 0.61 - 1.24 mg/dL   Calcium 8.9 8.9 - 09.6 mg/dL   Total Protein 7.3 6.5 - 8.1 g/dL   Albumin 3.3 (L) 3.5 - 5.0 g/dL   AST 23 15 - 41 U/L   ALT 8 (L) 17 - 63 U/L   Alkaline Phosphatase 103  38 - 126 U/L   Total Bilirubin 2.6 (H) 0.3 - 1.2 mg/dL   GFR calc non Af Amer >60 >60 mL/min   GFR calc Af Amer >60 >60 mL/min   Anion gap 14 5 - 15  CBC with Differential  Result Value Ref Range   WBC 5.2 4.0 - 10.5 K/uL   RBC 3.32 (L) 4.22 - 5.81 MIL/uL   Hemoglobin 10.0 (L) 13.0 - 17.0 g/dL   HCT 57.8 (L) 46.9 - 62.9 %   MCV 94.3 78.0 - 100.0 fL   MCH 30.1 26.0 - 34.0 pg   MCHC 31.9 30.0 - 36.0 g/dL   RDW 52.8 (H) 41.3 - 24.4 %   Platelets 132 (L) 150 - 400 K/uL   Neutrophils Relative % 61 %   Neutro Abs 3.1 1.7 - 7.7 K/uL   Lymphocytes Relative 28 %   Lymphs Abs 1.5 0.7 - 4.0 K/uL   Monocytes Relative 10 %   Monocytes Absolute 0.5 0.1 - 1.0 K/uL   Eosinophils Relative 1 %   Eosinophils Absolute 0.1 0.0 - 0.7 K/uL   Basophils Relative 0 %   Basophils Absolute 0.0 0.0 - 0.1 K/uL  Urinalysis, Routine w reflex microscopic  Result Value Ref Range   Color, Urine AMBER (A) YELLOW   APPearance CLOUDY (A) CLEAR   Specific Gravity, Urine 1.019 1.005 - 1.030   pH 6.5 5.0 - 8.0   Glucose, UA NEGATIVE NEGATIVE mg/dL   Hgb urine dipstick SMALL (A) NEGATIVE   Bilirubin Urine SMALL (A) NEGATIVE   Ketones, ur 15 (A) NEGATIVE mg/dL   Protein, ur NEGATIVE NEGATIVE mg/dL   Nitrite NEGATIVE NEGATIVE   Leukocytes, UA NEGATIVE NEGATIVE  Protime-INR  Result Value Ref Range   Prothrombin Time 17.0 (H) 11.6 - 15.2 seconds   INR 1.37 0.00 - 1.49  Lipase, blood  Result Value Ref Range   Lipase 28 11 - 51 U/L  Brain natriuretic peptide  Result Value Ref Range   B Natriuretic Peptide 498.5 (H) 0.0 - 100.0 pg/mL  Urine microscopic-add on  Result Value Ref Range    Squamous Epithelial / LPF 0-5 (A) NONE SEEN   WBC, UA 0-5 0 - 5 WBC/hpf   RBC / HPF 0-5 0 - 5 RBC/hpf   Bacteria, UA NONE SEEN NONE SEEN  I-Stat CG4 Lactic Acid, ED  Result Value Ref Range   Lactic Acid, Venous 1.86 0.5 - 2.0 mmol/L     Imaging Review Dg Chest 2 View  11/12/2015  CLINICAL DATA:  in bed since 3pm yesterday. Pt having chills. EMS reports that patient was under an electric blanket and numerous blankets. Pt's wife also reported that pt was having trouble getting words out. Pt is very SOB, confused, AMS. Hx afib, pleural effusion, aortic stenosis, heart disease EXAM: CHEST - 2 VIEW COMPARISON:  09/11/2015 FINDINGS: Left subclavian pacemaker stable in position. Moderate cardiomegaly as before. Atheromatous aorta. Mild central pulmonary vascular congestion and mild interstitial edema or infiltrates as before. Question early right mid lung airspace infiltrate. Some increase in left infrahilar atelectasis or infiltrate as well. Bilateral small pleural effusions. IMPRESSION: 1. Cardiomegaly and bilateral edema/infiltrates with small effusions suggesting CHF. 2. Progressive left infrahilar and right mid lung airspace opacities may represent atelectasis versus early pneumonia. Electronically Signed   By: Corlis Leak M.D.   On: 11/12/2015 14:26   Ct Head Wo Contrast  11/12/2015  CLINICAL DATA:  Confusion, altered mental status EXAM: CT HEAD WITHOUT CONTRAST TECHNIQUE: Contiguous  axial images were obtained from the base of the skull through the vertex without intravenous contrast. COMPARISON:  09/11/2015 FINDINGS: Motion degraded images. No evidence of parenchymal hemorrhage or extra-axial fluid collection. No mass lesion, mass effect, or midline shift. No CT evidence of acute infarction. Subcortical white matter and periventricular small vessel ischemic changes. Intracranial atherosclerosis. Global cortical and central atrophy. Mild secondary ventricular prominence. Partial opacification of the  bilateral ethmoid and left sphenoid sinuses. Mastoid air cells are essentially clear. No evidence of calvarial fracture. IMPRESSION: Motion degraded images. No evidence of acute intracranial abnormality. Atrophy with small vessel ischemic changes. Electronically Signed   By: Charline BillsSriyesh  Krishnan M.D.   On: 11/12/2015 15:35   I have personally reviewed and evaluated these images and lab results as part of my medical decision-making.   EKG Interpretation   Date/Time:  Sunday November 12 2015 14:28:44 EDT Ventricular Rate:  96 PR Interval:    QRS Duration: 117 QT Interval:  330 QTC Calculation: 417 R Axis:   57 Text Interpretation:  Atrial fibrillation Incomplete right bundle branch  block Nonspecific T abnormalities, lateral leads Confirmed by Celina Shiley   MD, Rayola Everhart ([id number]) on 11/12/2015 3:11:39 PM      MDM   Final diagnoses:  HCAP (healthcare-associated pneumonia)    Patient presents with fever altered mental status workup initiated as a septic workup. However patient's initial lactic acid is negative. Never had any hypotension. Initial blood pressure was 90/60 blood pressures now have been like 154 systolic. Chest x-ray shows evidence of pneumonia. Head CT was negative. Patient was treated for pneumonia back around March 22 when he was in rehabilitation following hip fracture. This would make this a HCAP.  Patient followed by Sherilyn CooterHenry trip [facility]. Contact triad hospitalist for admission. [facility]  Patient also clinically looked dehydrated. His areas raise some concerns for a little bit of fluid on the lungs. The patient's BNP is lower than usual. No leukocytosis. Urinalysis without evidence urinary tract infection.  Patient has a history of chronic atrial fib is on Coumadin. INR is subtherapeutic.    Vanetta MuldersScott Cortni Tays, MD 11/12/15 [phone]

## 2015-11-12 NOTE — Progress Notes (Signed)
ANTICOAGULATION CONSULT NOTE - Initial Consult  Pharmacy Consult for warfarin Indication: atrial fibrillation  No Known Allergies  Patient Measurements: Weight: 214 lb 11.7 oz (97.4 kg)  Vital Signs: Temp: 100.9 F (38.3 C) (04/23 1534) Temp Source: Rectal (04/23 1534) BP: 154/92 mmHg (04/23 1540) Pulse Rate: 249 (04/23 1540)  Labs:  Recent Labs  11/12/15 1340 11/12/15 1512  HGB 10.0*  --   HCT 31.3*  --   PLT 132*  --   LABPROT  --  17.0*  INR  --  1.37  CREATININE 0.79  --     Estimated Creatinine Clearance: 78.7 mL/min (by C-G formula based on Cr of 0.79).   Assessment: 82 yo presenting with fever, chills, AMS  PMH: CAD, dHF, AS, Afib s/p PPM, recent hip fx, pulm HTN, HTN, hx of stroke  AC: on warfarin pta for afib. Admit INR 1.37  Pt is in independent living at [facility] so tech in unable to figure out last warfarin dose or what she takes. Last med rec says 4 mg daily  Renal: SCr 0.79  Heme: H&H 10/31.3, Plt 132  Goal of Therapy:  INR 2-3 Monitor platelets by anticoagulation protocol: Yes   Plan:  Warfarin 4 mg x 1  Daily INR, CBC q72h Monitor for s/sx of bleeding F/U ability to confirm home dose  Isaac BlissMichael Crystalina Stodghill, PharmD, BCPS, [facility] Clinical Pharmacist Pager [phone] 11/12/2015 5:20 PM

## 2015-11-12 NOTE — H&P (Addendum)
History and Physical  Ventura Hollenbeck ZOX:[id number] DOB: Jan 05, 1926 DOA: 11/12/2015  PCP:  Florentina Jenny, MD   Chief Complaint:  AMS Chills  History of Present Illness:  Patient is a 82 yo male with history of CAD, diastolic HF, AS, Afib s/p PPM, recent hip fx, pulmonary HTN, HTN, stroke, who was brought by his son due to chills and AMS. Son said patient resides in assisted living and he was doing well at baseline a few days ago but the son got busy with his mom who has acute medical issues and when he went back to check on his dad he noticed that he was not himself, looked lethargic and was having chills. He did not particularly complain of anything to his sone. When I tried to wake the patient up, he was very sleepy but responding to verbal commands. He denied any complaints and answered no other questions. History was taken from son.   Review of Systems:  Unable to assess due to mental status: still he denied chest pain. When asked " what's bothering you" he said"nothing" then went back to sleep: was very sleepy.  Past Medical and Surgical History:   Past Medical History  Diagnosis Date  . Anemia   . Hypertension   . Heart disease   . Hearing loss   . Irregular heartbeat   . Stroke (HCC)   . Fracture of right hip (HCC)   . Presence of permanent cardiac pacemaker     Medtronic; placed October 2014  . Atrial fibrillation (HCC)   . Pain, acute due to trauma   . Pleural effusion   . Anticoagulated on Coumadin   . Aortic stenosis   . Postoperative anemia due to acute blood loss   . Hypokalemia   . Acute psychosis   . Glaucoma   . Chronic depression   . Bilateral edema of lower extremity   . Physical deconditioning   . Closed fracture of right hip with routine healing   . Current use of long term anticoagulation   . CAP (community acquired pneumonia)    Past Surgical History  Procedure Laterality Date  . Joint replacement    . Right and left knee replacements    .  Insert / replace / remove pacemaker  04/2013    Medtronic  . Intramedullary (im) nail intertrochanteric Right 09/13/2015    Procedure: INTRAMEDULLARY (IM) NAIL RIGHT HIP INTERTROCHANTERIC;  Surgeon: Tarry Kos, MD;  Location: [facility] OR;  Service: Orthopedics;  Laterality: Right;    Social History:   reports that he has quit smoking. He has never used smokeless tobacco. He reports that he does not drink alcohol or use illicit drugs.    No Known Allergies  Family History  Problem Relation Age of Onset  . Family history unknown: Yes      Prior to Admission medications   Medication Sig Start Date End Date Taking? Authorizing Provider  acetaminophen (TYLENOL) 500 MG tablet Take 1,000 mg by mouth 2 (two) times daily.    Historical Provider, MD  ferrous sulfate 325 (65 FE) MG tablet Take 325 mg by mouth 2 (two) times daily with a meal.    Historical Provider, MD  furosemide (LASIX) 40 MG tablet Take 40 mg by mouth daily. Hold if SBP <110    Historical Provider, MD  HYDROcodone-acetaminophen (NORCO) 7.5-325 MG tablet Take 1 tablet by mouth every 8 (eight) hours as needed for moderate pain.    Historical Provider, MD  lisinopril (PRINIVIL,ZESTRIL) 20  MG tablet Take 20 mg by mouth daily.    Historical Provider, MD  potassium chloride (K-DUR) 10 MEQ tablet Take 10 mEq by mouth. Give M-W-F    Historical Provider, MD  Protein (PROCEL) POWD Take 2 scoop by mouth 2 (two) times daily.    Historical Provider, MD  sertraline (ZOLOFT) 50 MG tablet Take 50 mg by mouth daily.    Historical Provider, MD  timolol (BETIMOL) 0.5 % ophthalmic solution Place 1 drop into the right eye daily.    Historical Provider, MD  warfarin (COUMADIN) 4 MG tablet Take 4 mg by mouth daily.    Historical Provider, MD    Physical Exam: BP 154/92 mmHg  Pulse 249  Temp(Src) 100.9 F (38.3 C) (Rectal)  Resp 31  Wt 97.4 kg (214 lb 11.7 oz)  SpO2 98%  GENERAL :  Breathing fast and loud, snoring, very sleepy HEAD:            normocephalic.  EARS:          Hard hearing NOSE:           No nasal discharge. THROAT:     Oral cavity and pharynx normal.   NECK:          supple CARDIAC:    Normal S1 and S2.systolic murmur LSB.   Vascular:     Bilateral LE edema R>> L ( Left wrapped in compressions). LUNGS:       Clear to auscultation anteriorly: could not have the patient sit up or roll.  ABDOMEN: Positive bowel sounds. Soft, nondistended, nontender. No guarding or rebound.       EXT           : No significant deformity or joint abnormality. Neuro        : sleepy, oriented to self and son.                      Could not assess further due to mental status                         Responds to verbal commands and withdraw for pain SKIN:            No rash. Noticed anteriorly           Labs on Admission:  Reviewed.   Radiological Exams on Admission: Dg Chest 2 View  11/12/2015  CLINICAL DATA:  in bed since 3pm yesterday. Pt having chills. EMS reports that patient was under an electric blanket and numerous blankets. Pt's wife also reported that pt was having trouble getting words out. Pt is very SOB, confused, AMS. Hx afib, pleural effusion, aortic stenosis, heart disease EXAM: CHEST - 2 VIEW COMPARISON:  09/11/2015 FINDINGS: Left subclavian pacemaker stable in position. Moderate cardiomegaly as before. Atheromatous aorta. Mild central pulmonary vascular congestion and mild interstitial edema or infiltrates as before. Question early right mid lung airspace infiltrate. Some increase in left infrahilar atelectasis or infiltrate as well. Bilateral small pleural effusions. IMPRESSION: 1. Cardiomegaly and bilateral edema/infiltrates with small effusions suggesting CHF. 2. Progressive left infrahilar and right mid lung airspace opacities may represent atelectasis versus early pneumonia. Electronically Signed   By: Corlis Leak M.D.   On: 11/12/2015 14:26   Ct Head Wo Contrast  11/12/2015  CLINICAL DATA:  Confusion, altered mental  status EXAM: CT HEAD WITHOUT CONTRAST TECHNIQUE: Contiguous axial images were obtained from the base of the skull through the  vertex without intravenous contrast. COMPARISON:  09/11/2015 FINDINGS: Motion degraded images. No evidence of parenchymal hemorrhage or extra-axial fluid collection. No mass lesion, mass effect, or midline shift. No CT evidence of acute infarction. Subcortical white matter and periventricular small vessel ischemic changes. Intracranial atherosclerosis. Global cortical and central atrophy. Mild secondary ventricular prominence. Partial opacification of the bilateral ethmoid and left sphenoid sinuses. Mastoid air cells are essentially clear. No evidence of calvarial fracture. IMPRESSION: Motion degraded images. No evidence of acute intracranial abnormality. Atrophy with small vessel ischemic changes. Electronically Signed   By: Charline BillsSriyesh  Krishnan M.D.   On: 11/12/2015 15:35    EKG:  Independently reviewed. Afib  Assessment/Plan  Altered mental status: Baseline: probably vascular dementia, oriented to person/place but declined cognitive function per son. Ddx:   Neurologic:           Structural (mass): head CT scan unremarkable for acute event          Stroke / TIA: unlikely but will keep in DDx if no improvement in 24-48 hours Meds/Toxins:          Iatrogenic. Polypharmacy is possible esp with opioids : will hold Norco Infectious:           UA w/o WBC. CXR with possible pneumonia: will tx for HCAP: Vanc/Zosyn  Metabolic : No  Hyponatremia/hypernatremia, Hypoixa , Hypercapnia, significant Hypoglycemia/hyperglycemia   Diastolic HF with mod-severe AS:  Will hold IVF given in the ER as BP is normal Will continue lasix at home dose : patient is not complaint with home meds Not on BB Continue ACEI/ARB Needs cardio f/u  CAD: stent in 90s Not on asp or statin.  ACS is unlikely but will trend trops  Stroke: not on asp or statin  Atrial fibrillation/flutter: Likely related  to underlying hypertensive heart disease and/or CAD. EKG checked Echo : done in Feb with diastolic dysfunction and AS. Permanent  CHADSVasc score : HTN, CHF, Stroke#2, Age : at least 4 Anticoagulation:  Coumadin  Rate/Rythm control: None  HTN: cont home meds  Input & Output: to be monitored Lines & Tubes: peripheral IV DVT prophylaxis: On AC GI prophylaxis:None Consultants: SW/CM in am Code Status: DNR/DNI ( discussed with son) Family Communication: son at bedside Disposition Plan: Tele bed.     Eston EstersAhmad Cassandr Cederberg M.D [facility]

## 2015-11-12 NOTE — Progress Notes (Signed)
Pharmacy Antibiotic Note  Jerrell Mylarugene Turnbough is a 82 y.o. male admitted on 11/12/2015 with sepsis.  Pharmacy has been consulted for Vancomycin and Zosyn dosing. WBC wnl.   Plan: -Vancomycin 2 gm IV load followed by Vancomycin 1250 mg IV Q 12 hours  -Zosyn 3.375 gm IV Q 8 hours      Temp (24hrs), Avg:101.4 F (38.6 C), Min:101.4 F (38.6 C), Max:101.4 F (38.6 C)   Recent Labs Lab 11/12/15 1340 11/12/15 1423  WBC 5.2  --   LATICACIDVEN  --  1.86    CrCl cannot be calculated (Patient has no serum creatinine result on file.).    No Known Allergies  Antimicrobials this admission: 4/23 Vanc>> 4/23 Zosyn>>    Dose adjustments this admission: None   Microbiology results: 4/23 BCx2>> 4/23 UCx>>  Thank you for allowing pharmacy to be a part of this patient's care.  Vinnie LevelBenjamin Shawnya Mayor, PharmD., BCPS Clinical Pharmacist Pager [phone]

## 2015-11-12 NOTE — ED Notes (Signed)
To room via EMS.  [facility]  Pts wife reported to EMS pt has been in bed since 3pm yesterday.  Has had chills.  EMS reports that patient was under an electric blanket and numerous blankets.  Pt wifes also reported that pt was having trouble getting words out.  Grips equal, no facial drooping.  Pt able to turn self side to side in bed.  Foul urine smell from depends.  This nurse took two depends that were soaked in urine off pt.

## 2015-11-13 ENCOUNTER — Encounter (HOSPITAL_COMMUNITY): Payer: Self-pay | Admitting: Family Medicine

## 2015-11-13 DIAGNOSIS — I482 Chronic atrial fibrillation: Secondary | ICD-10-CM

## 2015-11-13 DIAGNOSIS — D696 Thrombocytopenia, unspecified: Secondary | ICD-10-CM

## 2015-11-13 DIAGNOSIS — J189 Pneumonia, unspecified organism: Secondary | ICD-10-CM

## 2015-11-13 DIAGNOSIS — D649 Anemia, unspecified: Secondary | ICD-10-CM

## 2015-11-13 LAB — INFLUENZA PANEL BY PCR (TYPE A & B)
H1N1 flu by pcr: NOT DETECTED
INFLAPCR: NEGATIVE
Influenza B By PCR: NEGATIVE

## 2015-11-13 LAB — GLUCOSE, CAPILLARY
GLUCOSE-CAPILLARY: 120 mg/dL — AB (ref 65–99)
GLUCOSE-CAPILLARY: 111 mg/dL — AB (ref 65–99)
GLUCOSE-CAPILLARY: 102 mg/dL — AB (ref 65–99)
Glucose-Capillary: 123 mg/dL — ABNORMAL HIGH (ref 65–99)
Glucose-Capillary: 91 mg/dL (ref 65–99)

## 2015-11-13 LAB — CBC WITH DIFFERENTIAL/PLATELET
BASOS PCT: 1 %
Basophils Absolute: 0 10*3/uL (ref 0.0–0.1)
EOS ABS: 0.1 10*3/uL (ref 0.0–0.7)
Eosinophils Relative: 1 %
HEMATOCRIT: 30.6 % — AB (ref 39.0–52.0)
HEMOGLOBIN: 9.6 g/dL — AB (ref 13.0–17.0)
LYMPHS ABS: 1.6 10*3/uL (ref 0.7–4.0)
Lymphocytes Relative: 32 %
MCH: 29.6 pg (ref 26.0–34.0)
MCHC: 31.4 g/dL (ref 30.0–36.0)
MCV: 94.4 fL (ref 78.0–100.0)
MONOS PCT: 10 %
Monocytes Absolute: 0.5 10*3/uL (ref 0.1–1.0)
NEUTROS ABS: 2.7 10*3/uL (ref 1.7–7.7)
NEUTROS PCT: 57 %
Platelets: 110 10*3/uL — ABNORMAL LOW (ref 150–400)
RBC: 3.24 MIL/uL — AB (ref 4.22–5.81)
RDW: 17.1 % — ABNORMAL HIGH (ref 11.5–15.5)
WBC: 4.8 10*3/uL (ref 4.0–10.5)

## 2015-11-13 LAB — COMPREHENSIVE METABOLIC PANEL
ALT: 9 U/L — ABNORMAL LOW (ref 17–63)
ANION GAP: 9 (ref 5–15)
AST: 17 U/L (ref 15–41)
Albumin: 2.8 g/dL — ABNORMAL LOW (ref 3.5–5.0)
Alkaline Phosphatase: 82 U/L (ref 38–126)
BILIRUBIN TOTAL: 2.1 mg/dL — AB (ref 0.3–1.2)
BUN: 18 mg/dL (ref 6–20)
CO2: 24 mmol/L (ref 22–32)
Calcium: 8.5 mg/dL — ABNORMAL LOW (ref 8.9–10.3)
Chloride: 109 mmol/L (ref 101–111)
Creatinine, Ser: 0.88 mg/dL (ref 0.61–1.24)
Glucose, Bld: 117 mg/dL — ABNORMAL HIGH (ref 65–99)
POTASSIUM: 3.9 mmol/L (ref 3.5–5.1)
Sodium: 142 mmol/L (ref 135–145)
TOTAL PROTEIN: 6.2 g/dL — AB (ref 6.5–8.1)

## 2015-11-13 LAB — MAGNESIUM: MAGNESIUM: 1.9 mg/dL (ref 1.7–2.4)

## 2015-11-13 LAB — URINE CULTURE: Culture: 6000 — AB

## 2015-11-13 LAB — PROTIME-INR
INR: 1.56 — AB (ref 0.00–1.49)
PROTHROMBIN TIME: 18.7 s — AB (ref 11.6–15.2)

## 2015-11-13 MED ORDER — WARFARIN SODIUM 5 MG PO TABS
5.0000 mg | ORAL_TABLET | Freq: Once | ORAL | Status: AC
  Administered 2015-11-13: 5 mg via ORAL
  Filled 2015-11-13: qty 1

## 2015-11-13 NOTE — Discharge Instructions (Signed)

## 2015-11-13 NOTE — Progress Notes (Addendum)
PROGRESS NOTE  Timothy Joyce ZOX:[id number] DOB: 04/30/1926 DOA: 11/12/2015 PCP: Florentina Jenny, MD Outpatient Specialists:  Brief Narrative: 82 year old man currently living at her [name] brought to the emergency department with history of chills and confusion.  Assessment/Plan: 1. Suspected HCAP with fever to 101.4. No hypoxia. Respiratory status stable.no clinical evidence of decompensated heart failure on exam.lactic acid normal on admission. 2. Acute encephalopathy?appears close to baseline, does have underlying dementia. Polypharmacy, opioids considered.CT head negative. 3. Thrombocytopenia, likely secondary to infection. Follow clinically. 4. Isolated hyperbilirubinemia of unclear significance. Will check again in AM.  5. Normocytic anemia, previously evaluated by GI, felt to be related to acute blood lossfollowing surgery in February. Appears stable. 6. Atrial fibrillation maintained on warfarin. Paced rhythm on telemetry. 7. Moderate to severe aortic stenosis appears asymptomatic. 8. Severe pulmonary hypertension  9. Pacemaker in place 10. Bilateral lymphedema 11. Depression 12. Dementia per son.   Appears hemodynamically stable. Plan continue empiric antibiotics, if culture data remains negative and he continues to improve can likely transition to oral antibiotics 1-2 days.  CBC, CMP in a.m.  Warfarin per pharmacy  Physical therapy consultation. May need assisted living at this point.  DVT prophylaxis: warfarin Code Status:  DNR Family Communication: son at bedside [facility] Plan[facility]  [name], MD  [facility] Direct contact:  --Via amion app OR  --[email or web address]; password [email or web address] and click  7PM-7AM contact night coverage as above 11/13/2015, 12:53 PM  LOS: 1 day   Consultants:    Procedures:    Antimicrobials: Zosyn 4/23 >>  Vancomycin 4/23 >>   HPI/Subjective: Seems to feel okay. Has  dementia and history is limited.  Objective: Filed Vitals:   11/13/15 0014 11/13/15 0354 11/13/15 0838 11/13/15 1121  BP: 118/67 113/67 118/91 124/60  Pulse: 77 70 80 73  Temp: 100.4 F (38 C) 98.1 F (36.7 C) 97.5 F (36.4 C) 98.1 F (36.7 C)  TempSrc: Axillary Axillary Oral Oral  Resp: Height:      Weight:  94.1 kg (207 lb 7.3 oz)    SpO2: 93% 92% 95% 94%    Intake/Output Summary (Last 24 hours) at 11/13/15 1253 Last data filed at 11/13/15 1031  Gross per 24 hour  Intake   1740 ml  Output    600 ml  Net   1140 ml     Filed Weights   11/12/15 1534 11/12/15 1822 11/13/15 0354  Weight: 97.4 kg (214 lb 11.7 oz) 95.7 kg (210 lb 15.7 oz) 94.1 kg (207 lb 7.3 oz)    Exam:    Constitutional:  . Appears calm and comfortable ENMT:  . Hard of hearing  . Lips appear normal Respiratory:  . CTA bilaterally, no w/r/r.  . Respiratory effort normal. No retractions or accessory muscle use Cardiovascular:  . RRR, 3/6 holosystolic murmur, no r/g . 3+ right LE extremity edema, LLE wrapped.   . Telemetry paced rhythm Musculoskeletal:  . RUE, LUE, RLE, LLE   o strength and tone appear normal, no atrophy, no abnormal movements o No tenderness, masses o Moves all extremities to command Neurologic:  . Grossly normal Psychiatric:  . confused . Mental status o Mood, affect difficult to assess  I have personally reviewed following labs and imaging studies:  Total bilirubin 2.1, remainder LFTs unremarkable and basic metabolic panel  Lactic acid within normal limits  Urinalysis negative  Blood and urine cultures pending  Influenza PCR negative  Platelets  110, hemoglobin 9.6  CXR suggested HCAP especially right mid lung  Scheduled Meds: . acetaminophen  1,000 mg Oral BID  . feeding supplement (PRO-STAT SUGAR FREE 64)  30 mL Oral BID  . ferrous sulfate  325 mg Oral BID WC  . furosemide  40 mg Oral Daily  . lisinopril  20 mg Oral Daily  .  piperacillin-tazobactam (ZOSYN)  IV  3.375 g Intravenous Q8H  . sertraline  50 mg Oral Daily  . timolol  1 drop Right Eye Daily  . vancomycin  1,250 mg Intravenous Q12H  . warfarin  5 mg Oral ONCE-1800  . Warfarin - Pharmacist Dosing Inpatient   Does not apply q1800   Continuous Infusions:   Principal Problem:   HCAP (healthcare-associated pneumonia) Active Problems:   Atrial fibrillation (HCC)   Aortic stenosis   Pacemaker   Thrombocytopenia (HCC)   Hyperbilirubinemia   Normocytic anemia   LOS: 1 day   Time spent 20 minutes

## 2015-11-13 NOTE — Progress Notes (Signed)
ANTICOAGULATION CONSULT NOTE - Follow Up Consult  Pharmacy Consult:  Coumadin Indication: atrial fibrillation  No Known Allergies  Patient Measurements: Height: 6\' 1"  (185.4 cm) Weight: 207 lb 7.3 oz (94.1 kg) IBW/kg (Calculated) : 79.9  Vital Signs: Temp: 98.1 F (36.7 C) (04/24 0354) Temp Source: Axillary (04/24 0354) BP: 113/67 mmHg (04/24 0354) Pulse Rate: 70 (04/24 0354)  Labs:  Recent Labs  11/12/15 1340 11/12/15 1512 11/13/15 0310  HGB 10.0*  --  9.6*  HCT 31.3*  --  30.6*  PLT 132*  --  110*  LABPROT  --  17.0* 18.7*  INR  --  1.37 1.56*  CREATININE 0.79  --  0.88    Estimated Creatinine Clearance: 66.8 mL/min (by C-G formula based on Cr of 0.88).    Assessment: [age] YOM presented with AMS and chills.  Pharmacy consulted to manage Coumadin from PTA for history of Afib.  Patient's INR is sub-therapeutic; no bleeding reported.   Goal of Therapy:  INR 2-3  Vanc trough 15-20 mcg/mL    Plan:  - Coumadin 5mg  PO today - Daily PT / INR - Vanc 1250mg  IV Q12H - Zosyn 3.375gm IV Q8H, 4 hr infusion - Monitor renal fxn, clinical progress, vanc trough as indicated   Kingsley Herandez D. Laney Potashang, PharmD, BCPS Pager:  [phone] 11/13/2015, 6:49 AM

## 2015-11-14 LAB — COMPREHENSIVE METABOLIC PANEL
ALBUMIN: 2.5 g/dL — AB (ref 3.5–5.0)
ALT: 7 U/L — ABNORMAL LOW (ref 17–63)
ANION GAP: 10 (ref 5–15)
AST: 13 U/L — AB (ref 15–41)
Alkaline Phosphatase: 75 U/L (ref 38–126)
BILIRUBIN TOTAL: 1.9 mg/dL — AB (ref 0.3–1.2)
BUN: 17 mg/dL (ref 6–20)
CHLORIDE: 107 mmol/L (ref 101–111)
CO2: 23 mmol/L (ref 22–32)
Calcium: 8.1 mg/dL — ABNORMAL LOW (ref 8.9–10.3)
Creatinine, Ser: 0.72 mg/dL (ref 0.61–1.24)
GFR calc Af Amer: >60 mL/min (ref >60)
Glucose, Bld: 102 mg/dL — ABNORMAL HIGH (ref 65–99)
POTASSIUM: 3.4 mmol/L — AB (ref 3.5–5.1)
Sodium: 140 mmol/L (ref 135–145)
TOTAL PROTEIN: 5.9 g/dL — AB (ref 6.5–8.1)

## 2015-11-14 LAB — CBC
HEMATOCRIT: 28.7 % — AB (ref 39.0–52.0)
Hemoglobin: 9.2 g/dL — ABNORMAL LOW (ref 13.0–17.0)
MCH: 29.7 pg (ref 26.0–34.0)
MCHC: 32.1 g/dL (ref 30.0–36.0)
MCV: 92.6 fL (ref 78.0–100.0)
PLATELETS: 111 10*3/uL — AB (ref 150–400)
RBC: 3.1 MIL/uL — ABNORMAL LOW (ref 4.22–5.81)
RDW: 16.9 % — AB (ref 11.5–15.5)
WBC: 4.4 10*3/uL (ref 4.0–10.5)

## 2015-11-14 LAB — BASIC METABOLIC PANEL
BUN: 17 mg/dL (ref 4–21)
CREATININE: 0.7 mg/dL (ref 0.6–1.3)
GLUCOSE: 102 mg/dL
SODIUM: 140 mmol/L (ref 137–147)

## 2015-11-14 LAB — GLUCOSE, CAPILLARY: GLUCOSE-CAPILLARY: 91 mg/dL (ref 65–99)

## 2015-11-14 LAB — PROTIME-INR
INR: 1.85 — AB (ref 0.00–1.49)
Prothrombin Time: 21.3 seconds — ABNORMAL HIGH (ref 11.6–15.2)

## 2015-11-14 LAB — HEPATIC FUNCTION PANEL: Bilirubin, Total: 1.9 mg/dL

## 2015-11-14 MED ORDER — WARFARIN SODIUM 5 MG PO TABS
5.0000 mg | ORAL_TABLET | Freq: Once | ORAL | Status: AC
  Administered 2015-11-14: 5 mg via ORAL
  Filled 2015-11-14: qty 1

## 2015-11-14 NOTE — Progress Notes (Signed)
Orthopedic Tech Progress Note Patient Details:  Timothy Joyce 04/23/1926 [id number]  Ortho Devices Type of Ortho Device: Roland RackUnna boot Ortho Device/Splint Location: bilateral Ortho Device/Splint Interventions: Application   Jabri Blancett 11/14/2015, 2:10 PM

## 2015-11-14 NOTE — Clinical Social Work Note (Signed)
CSW spoke to patient's son Rosanne AshingJim [phone] who said he does not want patient to go to SNF.  [facility].  Patient's son states that he pays for patient to have private caregivers checking in on patient and his wife.  Patient's son states he is looking for ALFs for his parent to move into eventually.  Patient's son stated that [facility] and patient's son feels patient would benefit more from going home with home health.  CSW updated case manager, CSW to continue to follow patient's progress.  Ervin KnackEric R. Nataliee Shurtz, MSW, Theresia MajorsLCSWA [phone] 11/14/2015 5:43 PM

## 2015-11-14 NOTE — Progress Notes (Signed)
PROGRESS NOTE  Timothy Joyce ZOX:[id number] DOB: Jul 16, 1926 DOA: 11/12/2015 PCP: Florentina Jenny, MD Outpatient Specialists:  Brief Narrative: 82 year old man currently living at her [name] brought to the emergency department with history of chills and confusion.  Assessment/Plan: 1. Suspected HCAP with fever to 101.4. Overall appears improved. No hypoxia. Respiratory status remains stable. No evidence of decompensated heart failure. 2. Thrombocytopenia secondary to acute infection, stable. 3. Isolated hyperbilirubinemia of no apparent clinical significance. Consider gallbladder. 4. Normocytic anemia, stable. Previously evaluated by GI, felt to be related to surgery in February. 5. Atrial fibrillation maintained on warfarin. 6. Moderate to severe aortic stenosis appears asymptomatic. 7. Severe pulmonary hypertension  8. Pacemaker in place 9. Bilateral lymphedema 10. Dementia   Continues to improve.no evidence of severe infection, we'll stop vancomycin. Change to Augmentin 4/26.  Continue warfarin per pharmacy.  Anticipate transfer skilled nursing facility 4/26  DVT prophylaxis: warfarin Code Status:  DNR Family Communication: son at bedside Disposition Plan: Return to [facility] independent living vs ALF  [name], MD  [facility] Direct contact:  --Via amion app OR  --[email or web address]; password [email or web address] and click  7PM-7AM contact night coverage as above 11/14/2015, 11:36 AM  LOS: 2 days   Consultants:    Procedures:    Antimicrobials: Zosyn 4/23 >>  Augmentin 4/24 >> Vancomycin 4/23 >> 4/25  HPI/Subjective: Overall seems to be doing well. Breathing okay.No right upper quadrant pain.  Objective: Filed Vitals:   11/13/15 0838 11/13/15 1121 11/13/15 2100 11/14/15 0443  BP: 118/91 124/60 121/77 138/73  Pulse: 80 73 71 73  Temp: 97.5 F (36.4 C) 98.1 F (36.7 C) 98.2 F (36.8 C) 97.9 F (36.6 C)  TempSrc: Oral Oral Oral Oral  Resp: Height:      Weight:    96.9 kg (213 lb 10 oz)  SpO2: 95% 94% 96% 95%    Intake/Output Summary (Last 24 hours) at 11/14/15 1136 Last data filed at 11/14/15 0930  Gross per 24 hour  Intake   1610 ml  Output    850 ml  Net    760 ml     Filed Weights   11/12/15 1822 11/13/15 0354 11/14/15 0443  Weight: 95.7 kg (210 lb 15.7 oz) 94.1 kg (207 lb 7.3 oz) 96.9 kg (213 lb 10 oz)    Exam: Constitutional:  . Appears calm and comfortable Eyes:  . PERRL and irises appear normal ENMT:  . Lips appear normal . Oropharynx: mucosa, tongue appear normal Respiratory:  . CTA bilaterally, no w/r/r.  . Respiratory effort normal. No retractions or accessory muscle use Cardiovascular:  . RRR, no m/r/g . No LE extremity edema   Abdomen:  . Abdomen appears normal; no tenderness, no right upper quadrant pain . periumbilical hernia soft and easily reducible Musculoskeletal:  . Digits/nails: no clubbing, cyanosis, petechiae, infection . RUE, LUE, RLE, LLE   o strength and tone normal, no atrophy, no abnormal movements Psychiatric:  . Mental status o Mood, affect appropriate  I have personally reviewed following labs and imaging studies:  Urine culture insignificant growth  Basic metabolic panel unremarkable  Total bilirubin 1.9  Hemoglobin stable 9.2, platelet count stable at 11  Scheduled Meds: . acetaminophen  1,000 mg Oral BID  . feeding supplement (PRO-STAT SUGAR FREE 64)  30 mL Oral BID  . ferrous sulfate  325 mg Oral BID WC  . furosemide  40 mg Oral Daily  . lisinopril  20 mg Oral Daily  .  piperacillin-tazobactam (ZOSYN)  IV  3.375 g Intravenous Q8H  . sertraline  50 mg Oral Daily  . timolol  1 drop Right Eye Daily  . vancomycin  1,250 mg Intravenous Q12H  . warfarin  5 mg Oral ONCE-1800  . Warfarin - Pharmacist Dosing Inpatient   Does not apply q1800   Continuous Infusions:   Principal Problem:   HCAP (healthcare-associated pneumonia) Active Problems:   Atrial  fibrillation (HCC)   Aortic stenosis   Pacemaker   Thrombocytopenia (HCC)   Hyperbilirubinemia   Normocytic anemia   LOS: 2 days   Time spent 20 minutes

## 2015-11-14 NOTE — Evaluation (Signed)
Occupational Therapy Evaluation Patient Details Name: Timothy Joyce MRN: [id number] DOB: 09/01/1925 Today's Date: 11/14/2015    History of Present Illness 82 yo male admitted with AMS and chills. pt with acute encephalopathy, suspected HCAP. PMH: CAD Afib s/p PPM ORIF R hip 2/22 with d/c to SNF [facility] until 11/07/15, HTN CVA, dementia   Clinical Impression   PT admitted with AMS. Pt currently with functional limitiations due to the deficits listed below (see OT problem list). PTA living at ALF with recent d/c from SNF level care. Pt requires (A) to initiate and sequence all adls this session. Pt would need constant care that uncertain ALF can provide at patients current level. Pt able to transfer. Pt could benefit from memory care if family available. Pt will benefit from skilled OT to increase their independence and safety with adls and balance to allow discharge SNF ( memory care).   Note: pt with dark stool during session. RN made aware.      Follow Up Recommendations  SNF;Supervision/Assistance - 24 hour    Equipment Recommendations  3 in 1 bedside comode;Hospital bed    Recommendations for Other Services       Precautions / Restrictions Precautions Precautions: Fall      Mobility Bed Mobility Overal bed mobility: Needs Assistance Bed Mobility: Supine to Sit     Supine to sit: Mod assist;HOB elevated     General bed mobility comments: Pt moving as a unit without segmental movements. OT lifting R LE toward EOB and pts entire body followed . pt able to long sit in bed with UE on rail.   Transfers Overall transfer level: Needs assistance Equipment used: Rolling walker (2 wheeled) Transfers: Sit to/from Stand Sit to Stand: Min assist;From elevated surface         General transfer comment: elevated surface able to initiate and complete     Balance Overall balance assessment: Needs assistance Sitting-balance support: Bilateral upper extremity supported;Feet  supported Sitting balance-Leahy Scale: Good     Standing balance support: Bilateral upper extremity supported;During functional activity Standing balance-Leahy Scale: Poor                              ADL Overall ADL's : Needs assistance/impaired Eating/Feeding: Minimal assistance;Bed level Eating/Feeding Details (indicate cue type and reason): requires (A) for initiation and fine motor cutting up food/ opening packages Grooming: Wash/dry hands;Wash/dry face;Oral care;Minimal assistance;Sitting Grooming Details (indicate cue type and reason): requires max cues and (A) to initiate Upper Body Bathing: Moderate assistance;Sitting   Lower Body Bathing: Total assistance;Sit to/from stand           Toilet Transfer: Minimal assistance;Stand-pivot;RW;BSC StatisticianToilet Transfer Details (indicate cue type and reason): cues for safety Toileting- Clothing Manipulation and Hygiene: Total assistance Toileting - Clothing Manipulation Details (indicate cue type and reason): pt total (A) and static standing for peri care. pt with dark stool. Rn notified.        General ADL Comments: Pt supine on arrival and c/o back itching. pt noted to have finger nail marks laterally across back. Pt provided bath and lotion to help with itching sensation. Pt unable to properly use call night system. Rn and Forensic scientisttech notified     Vision     Perception     Praxis      Pertinent Vitals/Pain Pain Assessment: No/denies pain     Hand Dominance Right   Extremity/Trunk Assessment Upper Extremity Assessment Upper Extremity Assessment: Generalized  weakness   Lower Extremity Assessment Lower Extremity Assessment: Defer to PT evaluation;RLE deficits/detail;LLE deficits/detail RLE Deficits / Details: pitting edema from knee down to foot LLE Deficits / Details: ace wrap on L LE knee to toes - chart does not note wound or reason for L LE wrap. Note drainage at the top of dressing at this time. Tech / RN  notified.    Cervical / Trunk Assessment Cervical / Trunk Assessment: Kyphotic   Communication Communication Communication: HOH   Cognition Arousal/Alertness: Awake/alert Behavior During Therapy: WFL for tasks assessed/performed Overall Cognitive Status: History of cognitive impairments - at baseline Area of Impairment: Safety/judgement;Memory     Memory: Decreased short-term memory   Safety/Judgement: Decreased awareness of safety;Decreased awareness of deficits     General Comments: Pt requires cues repeated due to cognitive deficits. pt HOH and will repeat statements back to therapist to ensure he heard correctly. pt with decr sequence and initiation. pt needed step by step hand over hand (A) to apply deodorant this session. pt needed max cues to wash face but with initiation able to complete task   General Comments       Exercises       Shoulder Instructions      Home Living Family/patient expects to be discharged to:: Skilled nursing facility                                 Additional Comments: Pt recently d/c to [facility] 11/07/15 from [facility]      Prior Functioning/Environment Level of Independence: Needs assistance             OT Diagnosis: Generalized weakness;Cognitive deficits   OT Problem List: Decreased strength;Decreased activity tolerance;Impaired balance (sitting and/or standing);Impaired vision/perception;Decreased cognition;Decreased safety awareness;Decreased knowledge of use of DME or AE;Decreased knowledge of precautions;Cardiopulmonary status limiting activity;Increased edema   OT Treatment/Interventions: Self-care/ADL training;Therapeutic exercise;DME and/or AE instruction;Therapeutic activities;Cognitive remediation/compensation;Patient/family education;Balance training    OT Goals(Current goals can be found in the care plan section) Acute Rehab OT Goals Patient Stated Goal: none stated OT Goal Formulation: Patient  unable to participate in goal setting Time For Goal Achievement: 11/28/15 Potential to Achieve Goals: Good  OT Frequency: Min 2X/week   Barriers to D/C: Decreased caregiver support  from ALF and wife currently with medical issues with son (A) per chart       Co-evaluation              End of Session Equipment Utilized During Treatment: Gait belt;Rolling walker Nurse Communication: Mobility status;Precautions  Activity Tolerance: Patient tolerated treatment well Patient left: in chair;with call bell/phone within reach;with chair alarm set;with nursing/sitter in room   Time: [phone] OT Time Calculation (min): 37 min Charges:  OT General Charges $OT Visit: 1 Procedure OT Evaluation $OT Eval Moderate Complexity: 1 Procedure OT Treatments $Self Care/Home Management : 8-22 mins G-Codes:    Boone Master B 19-Nov-2015, 9:47 AM  Mateo Flow   OTR/L Pager: [phone] Office: [phone] .

## 2015-11-14 NOTE — Progress Notes (Signed)
Introduced self to pt as Development worker, international aidincoming nurse 3-7p.  Pt A&0 x1 self.  Call bell at reach.  Instructed to call for assistance.  Will continue to monitor.  Amanda PeaNellie Graysyn Bache, Charity fundraiserN.

## 2015-11-14 NOTE — Evaluation (Signed)
Physical Therapy Evaluation Patient Details Name: Timothy Joyce MRN: [id number] DOB: 14-Jul-1926 Today's Date: 11/14/2015   History of Present Illness  82 yo male admitted with AMS and chills. pt with acute encephalopathy, suspected HCAP. PMH: CAD Afib s/p PPM ORIF R hip 2/22 with d/c to SNF [facility] until 11/07/15, HTN CVA, dementia    Clinical Impression  Pt admitted with above diagnosis. Pt currently with functional limitations due to the deficits listed below (see PT Problem List). Timothy Joyce presents w/ impaired balance and cognition and currently requires step by step sequencing for safe transfers and min assist for safe ambulation.  Pt currently living at ALF but unclear on if this ALF can provide the amount of assist pt currently requires.  Pt would likely be more appropriate to d/c to a memory care unit at a SNF. Pt will benefit from skilled PT to increase their independence and safety with mobility to allow discharge to the venue listed below.      Follow Up Recommendations SNF;Supervision/Assistance - 24 hour    Equipment Recommendations  Other (comment) (TBD at next venue of care)    Recommendations for Other Services       Precautions / Restrictions Precautions Precautions: Fall Restrictions Weight Bearing Restrictions: No      Mobility  Bed Mobility Overal bed mobility: Needs Assistance Bed Mobility: Supine to Sit     Supine to sit: Mod assist;HOB elevated     General bed mobility comments: Pt sitting in recliner chair upon PT arrival  Transfers Overall transfer level: Needs assistance Equipment used: Rolling walker (2 wheeled) Transfers: Sit to/from [employer] Sit to Stand: Min assist Stand pivot transfers: Min assist;+2 safety/equipment       General transfer comment: Cues for scooting to EOB and for hand placement.  Min assist to boost up to standing.  Step by step directional cues and min assist for proper sequencing when turning to sit in  chair.  Ambulation/Gait Ambulation/Gait assistance: Min assist;+2 safety/equipment Ambulation Distance (Feet): 80 Feet Assistive device: Rolling walker (2 wheeled) Gait Pattern/deviations: Step-through pattern;Decreased stride length;Trunk flexed   Gait velocity interpretation: Below normal speed for age/gender General Gait Details: Cues for upright posture and min assist at times managing RW when turning.    Stairs            Wheelchair Mobility    Modified Rankin (Stroke Patients Only)       Balance Overall balance assessment: Needs assistance Sitting-balance support: Bilateral upper extremity supported;Feet supported Sitting balance-Leahy Scale: Good     Standing balance support: Bilateral upper extremity supported;During functional activity Standing balance-Leahy Scale: Poor Standing balance comment: Relies on UE support                             Pertinent Vitals/Pain Pain Assessment: No/denies pain    Home Living Family/patient expects to be discharged to:: Skilled nursing facility                 Additional Comments: Pt recently d/c to [facility] 11/07/15 from [facility]    Prior Function Level of Independence: Needs assistance         Comments: no family available to provide/confirm PLOF      Hand Dominance   Dominant Hand: Right    Extremity/Trunk Assessment   Upper Extremity Assessment: Defer to OT evaluation           Lower Extremity Assessment: RLE  deficits/detail;LLE deficits/detail;Generalized weakness RLE Deficits / Details: pitting edema from knee down to foot LLE Deficits / Details: ace wrap on L LE knee to toes - chart does not note wound or reason for L LE wrap.   Cervical / Trunk Assessment: Kyphotic  Communication   Communication: HOH  Cognition Arousal/Alertness: Awake/alert Behavior During Therapy: WFL for tasks assessed/performed Overall Cognitive Status: History of cognitive impairments -  at baseline Area of Impairment: Safety/judgement;Memory     Memory: Decreased short-term memory   Safety/Judgement: Decreased awareness of safety;Decreased awareness of deficits     General Comments: Pt requires step by step cues for stand pivot transfer and min assist at times to turn the correct direction.      General Comments General comments (skin integrity, edema, etc.): edema noted R LE pitting    Exercises General Exercises - Lower Extremity Ankle Circles/Pumps: AROM;Both;10 reps;Seated Long Arc Quad: AROM;Both;10 reps;Seated      Assessment/Plan    PT Assessment Patient needs continued PT services  PT Diagnosis Generalized weakness   PT Problem List Decreased strength;Decreased activity tolerance;Decreased balance;Decreased knowledge of use of DME;Decreased safety awareness  PT Treatment Interventions DME instruction;Gait training;Functional mobility training;Therapeutic exercise;Therapeutic activities;Balance training;Cognitive remediation;Patient/family education   PT Goals (Current goals can be found in the Care Plan section) Acute Rehab PT Goals Patient Stated Goal: none stated PT Goal Formulation: With patient Time For Goal Achievement: 11/28/15 Potential to Achieve Goals: Good    Frequency Min 3X/week   Barriers to discharge   Unclear about how much assist avaialable to pt at current place of living    Co-evaluation               End of Session Equipment Utilized During Treatment: Gait belt Activity Tolerance: Patient tolerated treatment well Patient left: in chair;with call bell/phone within reach;with chair alarm set;with nursing/sitter in room Nurse Communication: Mobility status         Time: [id number] PT Time Calculation (min) (ACUTE ONLY): 22 min   Charges:   PT Evaluation $PT Eval Low Complexity: 1 Procedure     PT G Codes:       Encarnacion ChuAshley Abashian PT, DPT  Pager: [phone] Phone: [phone] 11/14/2015, 11:14 AM

## 2015-11-14 NOTE — Consult Note (Addendum)
WOC consulted while in the department for patient with LE edema and Unna's boot on the LLE.  I was able to visit with patient and his son at the bedside and found out from the son that he has been using Unna's boots for 5 years to manage his LE edema.  The RLE today has 3+ pitting edema with no compression in place, the son reports that the facility removed the Unna's boot from the RLE about 3 days ago.  The LLE is much smaller and currently does have an Unna's boot in place.  The son indicates the patient does not tolerate compression stockings and they really do not control his father's LE edema very well.  I will order Unna's boots to be replaced on the LLE and added on the RLE and to be changed 2x wk.  The son is appreciative of our attention to this.  I have communicated with the bedside nurse to page the orthopedic tech when the coban arrives to the department, written orders, and ordered Coban from materials.  The son does verify his father does not have any open wounds on the LLE, and I have assessed the RLE which is also intact.  I have explained to the patient what the plans are for rewrapping the bilateral LE, he is in agreement with that as well.   Discussed POC with patient and bedside nurse.  Re consult if needed, will not follow at this time. Thanks  Derold Dorsch Foot Lockerustin RN, CWOCN [phone])

## 2015-11-14 NOTE — Progress Notes (Signed)
ANTICOAGULATION CONSULT NOTE - Follow Up Consult  Pharmacy Consult:  Coumadin Indication: atrial fibrillation  No Known Allergies  Patient Measurements: Height: 6\' 1"  (185.4 cm) Weight: 213 lb 10 oz (96.9 kg) IBW/kg (Calculated) : 79.9  Vital Signs: Temp: 97.9 F (36.6 C) (04/25 0443) Temp Source: Oral (04/25 0443) BP: 138/73 mmHg (04/25 0443) Pulse Rate: 73 (04/25 0443)  Labs:  Recent Labs  11/12/15 1340 11/12/15 1512 11/13/15 0310 11/14/15 0335  HGB 10.0*  --  9.6* 9.2*  HCT 31.3*  --  30.6* 28.7*  PLT 132*  --  110* 111*  LABPROT  --  17.0* 18.7* 21.3*  INR  --  1.37 1.56* 1.85*  CREATININE 0.79  --  0.88 0.72    Estimated Creatinine Clearance: 79.8 mL/min (by C-G formula based on Cr of 0.72).    Assessment: [age] YOM presented with AMS and chills.  Pharmacy consulted to manage Coumadin from PTA for history of Afib.  Patient's INR is sub-therapeutic; no bleeding reported.   Goal of Therapy:  INR 2-3  Vanc trough 15-20 mcg/mL    Plan:  - Repeat Coumadin 5mg  PO today - Daily PT / INR - Vanc 1250mg  IV Q12H - Zosyn 3.375gm IV Q8H, 4 hr infusion - Monitor renal fxn, clinical progress, vanc trough as indicated - Consider resuming home [facility] (supplemented MWF PTA)    Jenille Laszlo D. Laney Potashang, PharmD, BCPS Pager:  [phone] 11/14/2015, 8:00 AM

## 2015-11-15 LAB — PROTIME-INR
INR: 1.76 — AB (ref 0.00–1.49)
Prothrombin Time: 20.5 seconds — ABNORMAL HIGH (ref 11.6–15.2)

## 2015-11-15 MED ORDER — AMOXICILLIN-POT CLAVULANATE 875-125 MG PO TABS: 1.0000 | ORAL_TABLET | Freq: Two times a day (BID) | ORAL | Status: DC

## 2015-11-15 MED ORDER — HYDROCODONE-ACETAMINOPHEN 7.5-325 MG PO TABS: 1.0000 | ORAL_TABLET | Freq: Three times a day (TID) | ORAL | Status: AC | PRN

## 2015-11-15 MED ORDER — WARFARIN SODIUM 5 MG PO TABS: 5.0000 mg | ORAL_TABLET | Freq: Once | ORAL | Status: DC

## 2015-11-15 MED ORDER — AMOXICILLIN-POT CLAVULANATE 875-125 MG PO TABS
1.0000 | ORAL_TABLET | Freq: Two times a day (BID) | ORAL | Status: DC
  Administered 2015-11-15: 1 via ORAL
  Filled 2015-11-15: qty 1

## 2015-11-15 NOTE — NC FL2 (Signed)
[place] MEDICAID FL2 LEVEL OF CARE SCREENING TOOL     IDENTIFICATION  Patient Name: Aikam Vinje Birthdate: [birth year] Sex: male Admission Date (Current Location): 11/12/2015  [facility] and [place] Number:  Producer, television/film/video and Address:  [facility] [place], [address] [place], [place] [address]      Provider Number: [id number]  Attending Physician Name and Address:  Standley Brooking, MD  Relative Name and Phone Number:  Hilery Wintle [phone]    Current Level of Care: Hospital Recommended Level of Care: Skilled Nursing Facility Prior Approval Number:    Date Approved/Denied:   PASRR Number: [id number]  Discharge Plan: SNF    Current Diagnoses: Patient Active Problem List   Diagnosis Date Noted  . HCAP (healthcare-associated pneumonia) 11/13/2015  . Thrombocytopenia (HCC) 11/13/2015  . Hyperbilirubinemia 11/13/2015  . Normocytic anemia 11/13/2015  . Aortic stenosis 09/14/2015  . Pacemaker 09/14/2015  . Postoperative anemia due to acute blood loss   . Anticoagulated on Coumadin   . Closed right hip fracture (HCC) 09/11/2015  . Atrial fibrillation (HCC) 09/11/2015  . Hypertension 09/11/2015  . Pain, acute due to trauma 09/11/2015  . Fracture of right hip (HCC) 09/11/2015  . Pleural effusion     Orientation RESPIRATION BLADDER Height & Weight     Self  Normal Incontinent Weight: 211 lb 6.7 oz (95.9 kg) Height:   (185.4 cm)  BEHAVIORAL SYMPTOMS/MOOD NEUROLOGICAL BOWEL NUTRITION STATUS      Continent Diet (Cardiac Diet)  AMBULATORY STATUS COMMUNICATION OF NEEDS Skin   Limited Assist Verbally Surgical wounds                       Personal Care Assistance Level of Assistance  Bathing, Dressing Bathing Assistance: Limited assistance   Dressing Assistance: Limited assistance     Functional Limitations Info             SPECIAL CARE FACTORS FREQUENCY  PT (By licensed PT)     PT Frequency: 5x a week               Contractures      Additional Factors Info  Psychotropic     Psychotropic Info: sertraline (ZOLOFT) tablet 50 mg         Current Medications (11/15/2015):  This is the current hospital active medication list Current Facility-Administered Medications  Medication Dose Route Frequency Provider Last Rate Last Dose  . acetaminophen (TYLENOL) tablet 1,000 mg  1,000 mg Oral BID Eston Esters, MD   1,000 mg at 11/15/15 1104  . amoxicillin-clavulanate (AUGMENTIN) 875-125 MG per tablet 1 tablet  1 tablet Oral Q12H Standley Brooking, MD      . feeding supplement (PRO-STAT SUGAR FREE 64) liquid 30 mL  30 mL Oral BID Eston Esters, MD   30 mL at 11/15/15 1103  . ferrous sulfate tablet 325 mg  325 mg Oral BID WC Eston Esters, MD   325 mg at 11/15/15 1103  . furosemide (LASIX) tablet 40 mg  40 mg Oral Daily Eston Esters, MD   40 mg at 11/15/15 1104  . HYDROcodone-acetaminophen (NORCO) 7.5-325 MG per tablet 1 tablet  1 tablet Oral Q8H PRN Eston Esters, MD      . lisinopril (PRINIVIL,ZESTRIL) tablet 20 mg  20 mg Oral Daily Eston Esters, MD   20 mg at 11/15/15 1105  . sertraline (ZOLOFT) tablet 50 mg  50 mg Oral Daily Eston Esters, MD   50 mg  at 11/15/15 1104  . timolol (BETIMOL) 0.5 % ophthalmic solution 1 drop  1 drop Right Eye Daily Eston EstersAhmad Hamad, MD   1 drop at 11/14/15 1025  . warfarin (COUMADIN) tablet 5 mg  5 mg Oral ONCE-1800 Lauren D Bajbus, RPH      . Warfarin - Pharmacist Dosing Inpatient   Does not apply q1800 Bertram MillardMichael A Maccia, [facility] SystemRPH         Discharge Medications: Please see discharge summary for a list of discharge medications.  Relevant Imaging Results:  Relevant Lab Results:   Additional Information [id number]  Darleene Cleavernterhaus, Dinah Lupa R, [place]WA

## 2015-11-15 NOTE — Progress Notes (Signed)
ANTICOAGULATION CONSULT NOTE - Follow Up Consult  Pharmacy Consult:  Coumadin Indication: atrial fibrillation  No Known Allergies  Patient Measurements: Height: 6\' 1"  (185.4 cm) Weight: 211 lb 6.7 oz (95.9 kg) IBW/kg (Calculated) : 79.9  Vital Signs: Temp: 98.4 F (36.9 C) (04/26 0639) Temp Source: Oral (04/26 0639) BP: 125/68 mmHg (04/26 0639) Pulse Rate: 72 (04/26 0639)  Labs:  Recent Labs  11/12/15 1340  11/13/15 0310 11/14/15 0335 11/15/15 0351  HGB 10.0*  --  9.6* 9.2*  --   HCT 31.3*  --  30.6* 28.7*  --   PLT 132*  --  110* 111*  --   LABPROT  --   < > 18.7* 21.3* 20.5*  INR  --   < > 1.56* 1.85* 1.76*  CREATININE 0.79  --  0.88 0.72  --   < > = values in this interval not displayed.  Estimated Creatinine Clearance: 79.4 mL/min (by C-G formula based on Cr of 0.72).  Assessment: [age] YOM presented with AMS and chills.  Pharmacy consulted to manage Coumadin from PTA for history of Afib.  Patient's INR is sub-therapeutic at 1.76. Hgb 9.2, plts 110- no bleeding reported.  Goal of Therapy:  INR 2-3    Plan:  - Repeat Coumadin 5mg  PO today - Daily PT / INR - Consider resuming home [facility] (supplemented MWF PTA)  Beyla Loney D. Evie Croston, PharmD, BCPS Clinical Pharmacist Pager: [phone] 11/15/2015 9:46 AM

## 2015-11-15 NOTE — Progress Notes (Signed)
At 1644 pt  d/c'd off floor to awaiting transport to [facility].  Kahlel Peake,RN.

## 2015-11-15 NOTE — Clinical Social Work Note (Signed)
Clinical Social Work Assessment  Patient Details  Name: Timothy Joyce MRN: [id number] Date of Birth: 07/01/1926  Date of referral:  11/15/15               Reason for consult:  Facility Placement                Permission sought to share information with:  Facility Medical sales representativeContact Representative, Family Supports Permission granted to share information::  Yes, Verbal Permission Granted  Name::      Laurance FlattenGouge,Jim Son [phone]  Agency::  SNF Admissions  Relationship::     Contact Information:     Housing/Transportation Living arrangements for the past 2 months:  Single Family Home Source of Information:  Adult Children Patient Interpreter Needed:  None Criminal Activity/Legal Involvement Pertinent to Current Situation/Hospitalization:  No - Comment as needed Significant Relationships:  Adult Children Lives with:  Spouse Do you feel safe going back to the place where you live?  No (Patient needs some short term rehab before he can go home.) Need for family participation in patient care:  Yes (Comment) (Patient has some dementia.)  Care giving concerns:  Patient needs some short term rehab before he can return back home.   Social Worker assessment / plan:  Patient is a pleasant 82 year old male who is alert and oriented x2.  [facility]  Patient lives with his wife and his family are trying to find an ALF for patient to move to.  Patient had some confusion CSW spoke to patient's son to complete assessment.  [facility] and had a fall at home which caused him to be readmitted into hospital.  Patient's son states patient is usually able to walk around, but was unable to get out of the chair today.  PT recommended SNF for short term rehab, patient has caregivers who come into patient's home 6x a day, but are not with patient 24 hours.  Patient's son did not have any other questions he was familiar with what to expect at  SNF and role of CSW.  Employment status:  Retired Health and safety inspectornsurance information:  Medicare PT Recommendations:  Skilled Nursing Facility Information / Referral to community resources:  Skilled Nursing Facility  Patient/Family's Response to care:  Patient and son in agreement to going to SNF for short term rehab.  Patient/Family's Understanding of and Emotional Response to Diagnosis, Current Treatment, and Prognosis:  Patient pleasantly confused, but understanding of current treatment plan and diagnosis.  Emotional Assessment Appearance:  Appears stated age Attitude/Demeanor/Rapport:    Affect (typically observed):  Appropriate, Stable, Calm, Pleasant Orientation:  Oriented to Self, Oriented to Place Alcohol / Substance use:  Not Applicable Psych involvement (Current and /or in the community):  No (Comment)  Discharge Needs  Concerns to be addressed:  Lack of Support Readmission within the last 30 days:  No Current discharge risk:  Lack of support system Barriers to Discharge:  No Barriers Identified   [place] Constablenterhaus, Roy Tokarz R, LCSWA 11/15/2015, 6:57 PM

## 2015-11-15 NOTE — Clinical Social Work Placement (Signed)
   CLINICAL SOCIAL WORK PLACEMENT  NOTE  Date:  11/15/2015  Patient Details  Name: Timothy Joyce MRN: [id number] Date of Birth: 10/26/1925  Clinical Social Work is seeking post-discharge placement for this patient at the Skilled  Nursing Facility level of care (*CSW will initial, date and re-position this form in  chart as items are completed):  Yes   Patient/family provided with [facility] Social Work Department's list of facilities offering this level of care within the geographic area requested by the patient (or if unable, by the patient's family).  Yes   Patient/family informed of their freedom to choose among providers that offer the needed level of care, that participate in Medicare, Medicaid or managed care program needed by the patient, have an available bed and are willing to accept the patient.  Yes   Patient/family informed of [facility]'s ownership interest in [facility] and [facility], as well as of the fact that they are under no obligation to receive care at these facilities.  PASRR submitted to EDS on 11/15/15     PASRR number received on       Existing PASRR number confirmed on 11/15/15     FL2 transmitted to all facilities in geographic area requested by pt/family on 11/15/15     FL2 transmitted to all facilities within larger geographic area on       Patient informed that his/her managed care company has contracts with or will negotiate with certain facilities, including the following:        Yes   Patient/family informed of bed offers received.  [facility]     Physician recommends and patient chooses bed at      [facility].  [facility]     Patient family notified on 11/15/15 of transfer.  Name of family member notified:  Myriam ForehandJim Dedmon, patient's son.     PHYSICIAN       Additional Comment:     _______________________________________________ Darleene CleaverAnterhaus, Tevis Conger R, LCSWA 11/15/2015, 7:03 PM

## 2015-11-15 NOTE — Progress Notes (Signed)
While [facility] noted pt very weak standing up and moving extremities.   F/u with son is awaiting to transport him back to IP facility. Pt;s son observe same and ask if CM/SW could be notified to assist with rehab before going back to IP facility .  Notified CM.  Will continue to monitor.  Amanda PeaNellie Charlesia Canaday, Charity fundraiserN.

## 2015-11-15 NOTE — Progress Notes (Signed)
[facility] prior to admission; [facility] to resume services at discharge. Abelino DerrickB Tanay Massiah [facility] InstituteRN,MHA,BSN [phone]

## 2015-11-15 NOTE — Discharge Summary (Addendum)
Physician Discharge Summary  Timothy Joyce ZOX:[id number] DOB: Jul 06, 1926 DOA: 11/12/2015  PCP: Florentina Jenny, MD  Admit date: 11/12/2015 Discharge date: 11/15/2015  Recommendations for Outpatient Follow-up:  1. F/u resolution of pneumonia 2. Suggest daily PT/INR until therapeutic/stable 3. Treatment for ongoing bilateral lower extremity lymphedema as previously performed with unna boots   Follow-up Information    Follow up with Florentina Jenny, MD. Schedule an appointment as soon as possible for a visit in 2 weeks.   Specialty:  Family Medicine   Contact information:   [address] [place] [address] [phone]      Discharge Diagnoses:  1. HCAP 2. Thrombocytopenia secondary to acute infection 3. Isolated  Hyperbilirubinemia 4. Normocytic anemia 5. Atrial fibrillation 6. Moderate to severe aortic stenosis 7. Severe pulmonary hypertension 8. Bilateral lymphedema 9. Acute encephalopathy secondary to infection, rapidly resolved.  Discharge Condition: improved Disposition: SNF  Diet recommendation: heart healthy  Filed Weights   11/13/15 0354 11/14/15 0443 11/15/15 0639  Weight: 94.1 kg (207 lb 7.3 oz) 96.9 kg (213 lb 10 oz) 95.9 kg (211 lb 6.7 oz)    History of present illness:  82 year old man living at [facility] brought to the emergency department with history of chills and confusion. Admitted for pneumonia.  Hospital Course:  Treated with empiric antibiotics with rapid clinical improvement, no hypoxia on discharge. Mild thrombocytopenia secondary to acute infection seen, expect spontaneous recovery. Isolated hyperbilirubinemia seen,with no apparent clinical significance.Anemia and other comorbidities remain stable. Evaluated by therapy with recommendations for skilled nursing facility rehabilitation, this was initially declined by the patient's son with request to return home with home health but prior to discharge son reversed decision and plan is  now for patient to go to SNF. Individual issues as below. 1. HCAP. Much improved. No hypoxia. Respiratory status stable. No evidence of decompensated heart failure. 2. Thrombocytopenia secondary to acute infection, stable. 3. Isolated hyperbilirubinemia of no apparent clinical significance. Seen 09/24/2015, suspect Gilbert's. No further evaluation suggested, asymptomatic. 4. Normocytic anemia, stable. Previously evaluated by GI, felt to be related to surgery in February. 5. Atrial fibrillation maintained on warfarin. 6. Moderate to severe aortic stenosis, asymptomatic. 7. Severe pulmonary hypertension  8. Pacemaker in place 9. Bilateral lymphedema, continues unna boots 10. Dementia  Antimicrobials: Zosyn 4/23 >> 4/26 Augmentin 4/24 >> 4/29 Vancomycin 4/23 >> 4/25  Discharge Instructions  Discharge Instructions    Diet - low sodium heart healthy    Complete by:  As directed      Discharge instructions    Complete by:  As directed   Call your physician or seek immediate medical attention for fever, confusion, lethargy, pain, shortness of breath or worsening of condition.     Increase activity slowly    Complete by:  As directed           Current Discharge Medication List    START taking these medications   Details  amoxicillin-clavulanate (AUGMENTIN) 875-125 MG tablet Take 1 tablet by mouth every 12 (twelve) hours. Last day 4/29.      CONTINUE these medications which have NOT CHANGED   Details  acetaminophen (TYLENOL) 500 MG tablet Take 1,000 mg by mouth 2 (two) times daily.    ferrous sulfate 325 (65 FE) MG tablet Take 325 mg by mouth 2 (two) times daily with a meal.    furosemide (LASIX) 40 MG tablet Take 40 mg by mouth daily. Hold if SBP <110    HYDROcodone-acetaminophen (NORCO) 7.5-325 MG tablet Take  1 tablet by mouth every 8 (eight) hours as needed for moderate pain.    lisinopril (PRINIVIL,ZESTRIL) 20 MG tablet Take 20 mg by mouth daily. Hold if SBP <100      potassium chloride (K-DUR) 10 MEQ tablet Take 10 mEq by mouth every Monday, Wednesday, and Friday. Give M-W-F    Protein (PROCEL) POWD Take 2 scoop by mouth 2 (two) times daily.    sertraline (ZOLOFT) 50 MG tablet Take 50 mg by mouth daily.    timolol (BETIMOL) 0.5 % ophthalmic solution Place 1 drop into the right eye daily.    warfarin (COUMADIN) 4 MG tablet Take 4 mg by mouth daily at 6 PM.        No Known Allergies  The results of significant diagnostics from this hospitalization (including imaging, microbiology, ancillary and laboratory) are listed below for reference.    Significant Diagnostic Studies: Dg Chest 2 View  11/12/2015  CLINICAL DATA:  in bed since 3pm yesterday. Pt having chills. EMS reports that patient was under an electric blanket and numerous blankets. Pt's wife also reported that pt was having trouble getting words out. Pt is very SOB, confused, AMS. Hx afib, pleural effusion, aortic stenosis, heart disease EXAM: CHEST - 2 VIEW COMPARISON:  09/11/2015 FINDINGS: Left subclavian pacemaker stable in position. Moderate cardiomegaly as before. Atheromatous aorta. Mild central pulmonary vascular congestion and mild interstitial edema or infiltrates as before. Question early right mid lung airspace infiltrate. Some increase in left infrahilar atelectasis or infiltrate as well. Bilateral small pleural effusions. IMPRESSION: 1. Cardiomegaly and bilateral edema/infiltrates with small effusions suggesting CHF. 2. Progressive left infrahilar and right mid lung airspace opacities may represent atelectasis versus early pneumonia. Electronically Signed   By: Corlis Leak M.D.   On: 11/12/2015 14:26   Ct Head Wo Contrast  11/12/2015  CLINICAL DATA:  Confusion, altered mental status EXAM: CT HEAD WITHOUT CONTRAST TECHNIQUE: Contiguous axial images were obtained from the base of the skull through the vertex without intravenous contrast. COMPARISON:  09/11/2015 FINDINGS: Motion degraded  images. No evidence of parenchymal hemorrhage or extra-axial fluid collection. No mass lesion, mass effect, or midline shift. No CT evidence of acute infarction. Subcortical white matter and periventricular small vessel ischemic changes. Intracranial atherosclerosis. Global cortical and central atrophy. Mild secondary ventricular prominence. Partial opacification of the bilateral ethmoid and left sphenoid sinuses. Mastoid air cells are essentially clear. No evidence of calvarial fracture. IMPRESSION: Motion degraded images. No evidence of acute intracranial abnormality. Atrophy with small vessel ischemic changes. Electronically Signed   By: Charline Bills M.D.   On: 11/12/2015 15:35    Microbiology: Recent Results (from the past 240 hour(s))  Culture, blood (Routine x 2)     Status: None (Preliminary result)   Collection Time: 11/12/15  1:40 PM  Result Value Ref Range Status   Specimen Description BLOOD RIGHT ANTECUBITAL  Final   Special Requests BOTTLES DRAWN AEROBIC AND ANAEROBIC 5CC  Final   Culture NO GROWTH 2 DAYS  Final   Report Status PENDING  Incomplete  Culture, blood (Routine x 2)     Status: None (Preliminary result)   Collection Time: 11/12/15  1:52 PM  Result Value Ref Range Status   Specimen Description BLOOD LEFT ANTECUBITAL  Final   Special Requests IN PEDIATRIC BOTTLE 5CC  Final   Culture NO GROWTH 2 DAYS  Final   Report Status PENDING  Incomplete  Urine culture     Status: Abnormal   Collection Time: 11/12/15  2:09 PM  Result Value Ref Range Status   Specimen Description URINE, CLEAN CATCH  Final   Special Requests NONE  Final   Culture 6,000 COLONIES/mL INSIGNIFICANT GROWTH (A)  Final   Report Status 11/13/2015 FINAL  Final     Labs: Basic Metabolic Panel:  Recent Labs Lab 11/12/15 1340 11/13/15 0310 11/14/15 0335  NA 141 142 140  K 4.4 3.9 3.4*  CL 105 109 107  CO2 22 24 23   GLUCOSE 82 117* 102*  BUN 13 18 17   CREATININE 0.79 0.88 0.72  CALCIUM 8.9  8.5* 8.1*  MG  --  1.9  --    Liver Function Tests:  Recent Labs Lab 11/12/15 1340 11/13/15 0310 11/14/15 0335  AST 23 17 13*  ALT 8* 9* 7*  ALKPHOS 103 82 75  BILITOT 2.6* 2.1* 1.9*  PROT 7.3 6.2* 5.9*  ALBUMIN 3.3* 2.8* 2.5*    Recent Labs Lab 11/12/15 1512  LIPASE 28   CBC:  Recent Labs Lab 11/12/15 1340 11/13/15 0310 11/14/15 0335  WBC 5.2 4.8 4.4  NEUTROABS 3.1 2.7  --   HGB 10.0* 9.6* 9.2*  HCT 31.3* 30.6* 28.7*  MCV 94.3 94.4 92.6  PLT 132* 110* 111*     Recent Labs  09/11/15 1942 11/12/15 1456  BNP 657.7* 498.5*   CBG:  Recent Labs Lab 11/13/15 0618 11/13/15 1122 11/13/15 1635 11/13/15 2124 11/14/15 0607  GLUCAP 91 120* 111* 102* 91    Principal Problem:   HCAP (healthcare-associated pneumonia) Active Problems:   Atrial fibrillation (HCC)   Aortic stenosis   Pacemaker   Thrombocytopenia (HCC)   Hyperbilirubinemia   Normocytic anemia   Time coordinating discharge: 25 minutes  Signed:  Brendia Sacksaniel Amalio Loe, MD [facility] 11/15/2015, 10:46 AM

## 2015-11-15 NOTE — Progress Notes (Signed)
Report called to [place], nurse at [facility].  Verbalized understanding.  Amanda PeaNellie Liridona Mashaw, Charity fundraiserN.

## 2015-11-15 NOTE — Clinical Social Work Note (Signed)
Patient has a [facility] available [facility] for today.  CSW contacted patient's son who is in agreement to having [facility]. Patient to be transported via wheelchair [facility].  Patient's son was at bedside and was informed of patient's discharge.  Nurse to call [facility] to [facility] [phone].  Ervin KnackEric R. Odyn Turko, MSW, Theresia MajorsLCSWA [phone] 11/15/2015 6:54 PM

## 2015-11-15 NOTE — Progress Notes (Signed)
PROGRESS NOTE  Timothy Joyce ZOX:[id number] DOB: Jun 05, 1926 DOA: 11/12/2015 PCP: Florentina Jenny, MD Outpatient Specialists:  Brief Narrative: 82 year old man currently living at [name] brought to the emergency department with history of chills and confusion.  Assessment/Plan: 1. HCAP. Much improved. No hypoxia. Respiratory status stable. No evidence of decompensated heart failure. 2. Thrombocytopenia secondary to acute infection, stable. 3. Isolated hyperbilirubinemia of no apparent clinical significance. Seen 09/24/2015, suspect Gilbert's. No further evaluation suggested, asymptomatic. 4. Normocytic anemia, stable. Previously evaluated by GI, felt to be related to surgery in February. 5. Atrial fibrillation maintained on warfarin. 6. Moderate to severe aortic stenosis, asymptomatic. 7. Severe pulmonary hypertension  8. Pacemaker in place 9. Bilateral lymphedema, continues unna boots 10. Dementia   Improving, afebrile, stable for discharge  Change to Augmentin.  DVT prophylaxis: warfarin Code Status:  DNR Family Communication: son [name] by telephone, discussed discharge Disposition Plan: Return to [facility] independent living with [facility] today. Son declined SNF, he requested return to [facility] and is independently pursuing ALF.  [name], MD  [facility] Direct contact:  --Via amion app OR  --[email or web address]; password [email or web address] and click  7PM-7AM contact night coverage as above 11/15/2015, 10:36 AM  LOS: 3 days   Consultants:    Procedures:    Antimicrobials: Zosyn 4/23 >> 4/26 Augmentin 4/24 >> 4/29 Vancomycin 4/23 >> 4/25  HPI/Subjective: Doing well, breathing fine.   Objective: Filed Vitals:   11/14/15 0443 11/14/15 1154 11/14/15 1954 11/15/15 0639  BP: 138/73 112/58 128/64 125/68  Pulse: 73 73 69 72  Temp: 97.9 F (36.6 C) 97.7 F (36.5 C) 98.3 F (36.8 C) 98.4 F (36.9 C)  TempSrc: Oral Oral Oral Oral  Resp: Height:      Weight:  96.9 kg (213 lb 10 oz)   95.9 kg (211 lb 6.7 oz)  SpO2: 95% 94% 97% 98%    Intake/Output Summary (Last 24 hours) at 11/15/15 1036 Last data filed at 11/15/15 0858  Gross per 24 hour  Intake    290 ml  Output    850 ml  Net   -560 ml     Filed Weights   11/13/15 0354 11/14/15 0443 11/15/15 0639  Weight: 94.1 kg (207 lb 7.3 oz) 96.9 kg (213 lb 10 oz) 95.9 kg (211 lb 6.7 oz)    Exam: Constitutional:  . Appears calm and comfortable Respiratory:  . CTA bilaterally, no w/r/r.  . Respiratory effort normal. No retractions or accessory muscle use Cardiovascular:  . RRR, no m/r/g . Bilateral LE in unna boots Psychiatric:  . Mental status o Mood, affect appropriate  I have personally reviewed following labs and imaging studies:  BC NGTD  Scheduled Meds: . acetaminophen  1,000 mg Oral BID  . feeding supplement (PRO-STAT SUGAR FREE 64)  30 mL Oral BID  . ferrous sulfate  325 mg Oral BID WC  . furosemide  40 mg Oral Daily  . lisinopril  20 mg Oral Daily  . piperacillin-tazobactam (ZOSYN)  IV  3.375 g Intravenous Q8H  . sertraline  50 mg Oral Daily  . timolol  1 drop Right Eye Daily  . warfarin  5 mg Oral ONCE-1800  . Warfarin - Pharmacist Dosing Inpatient   Does not apply q1800   Continuous Infusions:   Principal Problem:   HCAP (healthcare-associated pneumonia) Active Problems:   Atrial fibrillation (HCC)   Aortic stenosis   Pacemaker   Thrombocytopenia (HCC)   Hyperbilirubinemia   Normocytic anemia  LOS: 3 days

## 2015-11-16 ENCOUNTER — Encounter: Payer: Self-pay | Admitting: Internal Medicine

## 2015-11-16 ENCOUNTER — Non-Acute Institutional Stay (SKILLED_NURSING_FACILITY): Payer: Medicare Other | Admitting: Internal Medicine

## 2015-11-16 DIAGNOSIS — R5381 Other malaise: Secondary | ICD-10-CM

## 2015-11-16 DIAGNOSIS — I482 Chronic atrial fibrillation, unspecified: Secondary | ICD-10-CM

## 2015-11-16 DIAGNOSIS — Z7901 Long term (current) use of anticoagulants: Secondary | ICD-10-CM | POA: Diagnosis not present

## 2015-11-16 DIAGNOSIS — F329 Major depressive disorder, single episode, unspecified: Secondary | ICD-10-CM | POA: Diagnosis not present

## 2015-11-16 DIAGNOSIS — J189 Pneumonia, unspecified organism: Secondary | ICD-10-CM | POA: Diagnosis not present

## 2015-11-16 DIAGNOSIS — E46 Unspecified protein-calorie malnutrition: Secondary | ICD-10-CM | POA: Diagnosis not present

## 2015-11-16 DIAGNOSIS — F32A Depression, unspecified: Secondary | ICD-10-CM

## 2015-11-16 DIAGNOSIS — D696 Thrombocytopenia, unspecified: Secondary | ICD-10-CM | POA: Diagnosis not present

## 2015-11-16 DIAGNOSIS — E876 Hypokalemia: Secondary | ICD-10-CM | POA: Diagnosis not present

## 2015-11-16 DIAGNOSIS — I10 Essential (primary) hypertension: Secondary | ICD-10-CM

## 2015-11-16 DIAGNOSIS — D638 Anemia in other chronic diseases classified elsewhere: Secondary | ICD-10-CM | POA: Diagnosis not present

## 2015-11-16 DIAGNOSIS — F0390 Unspecified dementia without behavioral disturbance: Secondary | ICD-10-CM | POA: Diagnosis not present

## 2015-11-16 DIAGNOSIS — I890 Lymphedema, not elsewhere classified: Secondary | ICD-10-CM | POA: Diagnosis not present

## 2015-11-16 NOTE — Progress Notes (Signed)
LOCATION: [facility]  PCP: Florentina JennyRIPP, HENRY, MD   Code Status: DNR  Goals of care: Advanced Directive information Advanced Directives 11/07/2015  Does patient have an advance directive? Yes  Type of Advance Directive Out of facility DNR (pink MOST or yellow form)  Does patient want to make changes to advanced directive? No - Patient declined  Copy of advanced directive(s) in chart? Yes  Would patient like information on creating an advanced directive? -       Extended Emergency Contact Information Primary Emergency Contact: Masso,Jim Address: [address]          [place], [place] [id number] [place] Home Phone: [phone] Relation: Son   No Known Allergies  Chief Complaint  Patient presents with  . New Admit To SNF    New Admission     HPI:  Patient is a 82 y.o. male seen today for short term rehabilitation post hospital admission from 11/12/15-11/15/15 with HCAP. He was started on antibiotics. He has PMH of afib, aortic stenosis, severe pulmonary HTN, dementia among others. He is seen in his room today. He was residing in ALF prior to this.   Review of Systems:  Constitutional: Negative for fever, chills, diaphoresis.  HENT: Negative for headache, congestion, nasal discharge, difficulty swallowing.   Eyes: Negative for blurred vision, double vision and discharge.  Respiratory: Negative for cough, shortness of breath and wheezing.   Cardiovascular: Negative for chest pain, palpitations, leg swelling.  Gastrointestinal: Negative for heartburn, nausea, vomiting, abdominal pain. Last bowel movement was yesterday. Genitourinary: Negative for dysuria and flank pain. Positive for increased frequency.  Musculoskeletal: Negative for back pain, fall in the facility.  Skin: Negative for itching, rash.  Neurological: Negative for dizziness. Psychiatric/Behavioral: Negative for depression.   Past Medical History  Diagnosis Date  . Anemia   . Hypertension     . Heart disease   . Hearing loss   . Irregular heartbeat   . Stroke (HCC)   . Fracture of right hip (HCC)   . Presence of permanent cardiac pacemaker     Medtronic; placed October 2014  . Atrial fibrillation (HCC)   . Pain, acute due to trauma   . Pleural effusion   . Anticoagulated on Coumadin   . Aortic stenosis   . Postoperative anemia due to acute blood loss   . Hypokalemia   . Acute psychosis   . Glaucoma   . Chronic depression   . Bilateral edema of lower extremity   . Physical deconditioning   . Closed fracture of right hip with routine healing   . Current use of long term anticoagulation   . CAP (community acquired pneumonia)    Past Surgical History  Procedure Laterality Date  . Joint replacement    . Right and left knee replacements    . Insert / replace / remove pacemaker  04/2013    Medtronic  . Intramedullary (im) nail intertrochanteric Right 09/13/2015    Procedure: INTRAMEDULLARY (IM) NAIL RIGHT HIP INTERTROCHANTERIC;  Surgeon: Tarry KosNaiping M Xu, MD;  Location: [facility] OR;  Service: Orthopedics;  Laterality: Right;   Social History:   reports that he has quit smoking. He has never used smokeless tobacco. He reports that he does not drink alcohol or use illicit drugs.  Family History  Problem Relation Age of Onset  . Family history unknown: Yes    Medications:   Medication List       This list is accurate as of:  11/16/15  3:39 PM.  Always use your most recent med list.               acetaminophen 500 MG tablet  Commonly known as:  TYLENOL  Take 1,000 mg by mouth 2 (two) times daily.     amoxicillin-clavulanate 875-125 MG tablet  Commonly known as:  AUGMENTIN  Take 1 tablet by mouth every 12 (twelve) hours. Last day 4/29.     ferrous sulfate 325 (65 FE) MG tablet  Take 325 mg by mouth 2 (two) times daily with a meal.     furosemide 40 MG tablet  Commonly known as:  LASIX  Take 40 mg by mouth daily. Hold if SBP <110     HYDROcodone-acetaminophen  7.5-325 MG tablet  Commonly known as:  NORCO  Take 1 tablet by mouth every 8 (eight) hours as needed for moderate pain.     lisinopril 20 MG tablet  Commonly known as:  PRINIVIL,ZESTRIL  Take 20 mg by mouth daily. Hold if SBP <100     potassium chloride 10 MEQ tablet  Commonly known as:  K-DUR  Take 10 mEq by mouth every Monday, Wednesday, and Friday. Give M-W-F     PROCEL Powd  Take 2 scoop by mouth 2 (two) times daily.     sertraline 50 MG tablet  Commonly known as:  ZOLOFT  Take 50 mg by mouth daily.     timolol 0.5 % ophthalmic solution  Commonly known as:  BETIMOL  Place 1 drop into the right eye daily.     warfarin 4 MG tablet  Commonly known as:  COUMADIN  Take 4 mg by mouth daily at 6 PM.        Immunizations: Immunization History  Administered Date(s) Administered  . PPD Test 09/15/2015     Physical Exam: Filed Vitals:   11/16/15 1533  BP: 148/92  Pulse: 83  Temp: 98.3 F (36.8 C)  TempSrc: Oral  Resp: 20  Height: 6' (1.829 m)  Weight: 215 lb 9.6 oz (97.796 kg)  SpO2: 98%   Body mass index is 29.23 kg/(m^2).   General- elderly frail male, in no acute distress Head- normocephalic, atraumatic Nose- no maxillary or frontal sinus tenderness, no nasal discharge Throat- moist mucus membrane Eyes- PERRLA, EOMI, no pallor, no icterus, no discharge, normal conjunctiva, normal sclera Neck- no cervical lymphadenopathy Cardiovascular- normal s1,s2, + murmur, pitting leg edema right > left, unna boot present, pacemaker lead to left chest wall Respiratory- bilateral clear to auscultation, no wheeze, no rhonchi, no crackles, no use of accessory muscles Abdomen- bowel sounds present, soft, non tender Musculoskeletal- able to move all 4 extremities, generalized weakness more to lower extremities, arthritis changes to fingers Neurological- pleasantly confused Skin- warm and dry, easy bruising Psychiatry- normal mood and affect     Labs reviewed: Basic  Metabolic Panel:  Recent Labs  11/91/47 1340 11/13/15 0310 11/14/15 11/14/15 0335  NA 141 142 140 140  K 4.4 3.9  --  3.4*  CL 105 109  --  107  CO2 22 24  --  23  GLUCOSE 82 117*  --  102*  BUN 13 18 17 17   CREATININE 0.79 0.88 0.7 0.72  CALCIUM 8.9 8.5*  --  8.1*  MG  --  1.9  --   --    Liver Function Tests:  Recent Labs  11/12/15 1340 11/13/15 0310 11/14/15 0335  AST 23 17 13*  ALT 8* 9* 7*  ALKPHOS 103  82 75  BILITOT 2.6* 2.1* 1.9*  PROT 7.3 6.2* 5.9*  ALBUMIN 3.3* 2.8* 2.5*    Recent Labs  11/12/15 1512  LIPASE 28   No results for input(s): AMMONIA in the last 8760 hours. CBC:  Recent Labs  10/26/15 11/12/15 1340 11/13/15 0310 11/14/15 0335  WBC 3.4 5.2 4.8 4.4  NEUTROABS 1 3.1 2.7  --   HGB 9.1* 10.0* 9.6* 9.2*  HCT 29* 31.3* 30.6* 28.7*  MCV  --  94.3 94.4 92.6  PLT 179 132* 110* 111*   Cardiac Enzymes: No results for input(s): CKTOTAL, CKMB, CKMBINDEX, TROPONINI in the last 8760 hours. BNP: Invalid input(s): POCBNP CBG:  Recent Labs  11/13/15 1635 11/13/15 2124 11/14/15 0607  GLUCAP 111* 102* 91    Radiological Exams: Dg Chest 2 View  11/12/2015  CLINICAL DATA:  in bed since 3pm yesterday. Pt having chills. EMS reports that patient was under an electric blanket and numerous blankets. Pt's wife also reported that pt was having trouble getting words out. Pt is very SOB, confused, AMS. Hx afib, pleural effusion, aortic stenosis, heart disease EXAM: CHEST - 2 VIEW COMPARISON:  09/11/2015 FINDINGS: Left subclavian pacemaker stable in position. Moderate cardiomegaly as before. Atheromatous aorta. Mild central pulmonary vascular congestion and mild interstitial edema or infiltrates as before. Question early right mid lung airspace infiltrate. Some increase in left infrahilar atelectasis or infiltrate as well. Bilateral small pleural effusions. IMPRESSION: 1. Cardiomegaly and bilateral edema/infiltrates with small effusions suggesting CHF. 2.  Progressive left infrahilar and right mid lung airspace opacities may represent atelectasis versus early pneumonia. Electronically Signed   By: Corlis Leak M.D.   On: 11/12/2015 14:26   Ct Head Wo Contrast  11/12/2015  CLINICAL DATA:  Confusion, altered mental status EXAM: CT HEAD WITHOUT CONTRAST TECHNIQUE: Contiguous axial images were obtained from the base of the skull through the vertex without intravenous contrast. COMPARISON:  09/11/2015 FINDINGS: Motion degraded images. No evidence of parenchymal hemorrhage or extra-axial fluid collection. No mass lesion, mass effect, or midline shift. No CT evidence of acute infarction. Subcortical white matter and periventricular small vessel ischemic changes. Intracranial atherosclerosis. Global cortical and central atrophy. Mild secondary ventricular prominence. Partial opacification of the bilateral ethmoid and left sphenoid sinuses. Mastoid air cells are essentially clear. No evidence of calvarial fracture. IMPRESSION: Motion degraded images. No evidence of acute intracranial abnormality. Atrophy with small vessel ischemic changes. Electronically Signed   By: Charline Bills M.D.   On: 11/12/2015 15:35    Assessment/Plan  Physical deconditioning Will have patient work with PT/OT as tolerated to regain strength and restore function.  Fall precautions are in place.  HCAP Improved breathing. Continue and complete course of augmentin bid until 11/18/15. Encourage to use incentive spirometer. Aspiration precautions  Anemia of chronic disease Continue feso4 bid, monitor cbc  Lymphedema of leg Continue dressing change and continue lasix 40 mg daily, check bmp  Thrombocytopenia Low platelet count, no bleed reported. On coumadin, monitor platelet count  Protein calorie malnutrition Monitor po intake, continue protein supplement and get dietary consult  HTN Elevated BP. Monitor bp reading. Continue lisinopril 20 mg daily  Hypokalemia Continue kcl,  with him on lasix monitor bmp  afib Rate controlled. Continue coumadin for anticoagulation with goal inr 2-3  Long term anticoagulation Monitor INR  Chronic depression Continue zoloft at bedtime  Dementia No behavioral disturbance, continue supportive care for now   Goals of care: short term rehabilitation   Labs/tests ordered: cbc, cmp  11/20/15  Family/ staff Communication: reviewed care plan with patient and nursing supervisor    Oneal Grout, MD Internal Medicine [facility] [address] [place], [place] [address] Cell Phone (Monday-Friday 8 am - 5 pm): [phone] On Call: [phone] and follow prompts after 5 pm and on weekends Office Phone: [phone] Office Fax: [phone]

## 2015-11-17 LAB — CULTURE, BLOOD (ROUTINE X 2)
CULTURE: NO GROWTH
Culture: NO GROWTH

## 2015-11-20 ENCOUNTER — Encounter: Payer: Self-pay | Admitting: Adult Health

## 2015-11-20 ENCOUNTER — Non-Acute Institutional Stay (SKILLED_NURSING_FACILITY): Payer: Medicare Other | Admitting: Adult Health

## 2015-11-20 DIAGNOSIS — F329 Major depressive disorder, single episode, unspecified: Secondary | ICD-10-CM

## 2015-11-20 DIAGNOSIS — E876 Hypokalemia: Secondary | ICD-10-CM | POA: Diagnosis not present

## 2015-11-20 DIAGNOSIS — D638 Anemia in other chronic diseases classified elsewhere: Secondary | ICD-10-CM

## 2015-11-20 DIAGNOSIS — I10 Essential (primary) hypertension: Secondary | ICD-10-CM

## 2015-11-20 DIAGNOSIS — E43 Unspecified severe protein-calorie malnutrition: Secondary | ICD-10-CM

## 2015-11-20 DIAGNOSIS — F32A Depression, unspecified: Secondary | ICD-10-CM

## 2015-11-20 DIAGNOSIS — J189 Pneumonia, unspecified organism: Secondary | ICD-10-CM

## 2015-11-20 DIAGNOSIS — H409 Unspecified glaucoma: Secondary | ICD-10-CM

## 2015-11-20 DIAGNOSIS — Z7901 Long term (current) use of anticoagulants: Secondary | ICD-10-CM | POA: Diagnosis not present

## 2015-11-20 DIAGNOSIS — R5381 Other malaise: Secondary | ICD-10-CM | POA: Diagnosis not present

## 2015-11-20 DIAGNOSIS — F0390 Unspecified dementia without behavioral disturbance: Secondary | ICD-10-CM

## 2015-11-20 DIAGNOSIS — I890 Lymphedema, not elsewhere classified: Secondary | ICD-10-CM

## 2015-11-20 DIAGNOSIS — D696 Thrombocytopenia, unspecified: Secondary | ICD-10-CM

## 2015-11-20 DIAGNOSIS — I482 Chronic atrial fibrillation, unspecified: Secondary | ICD-10-CM

## 2015-11-20 NOTE — Progress Notes (Signed)
DATE:  11/20/15  MRN:  [id number]  BIRTHDAY: 02/27/1926  Facility:  Nursing Home Location:  [facility]  Nursing Home Room Number: [phone]  LEVEL OF CARE:  SNF [phone])  Contact Information    Name Relation Home Work [place] Son [phone]         Code Status History    Date Active Date Inactive Code Status Order ID Comments User Context   11/12/2015  5:06 PM 11/15/2015  7:45 PM DNR [id number]  Eston Esters, MD ED   09/11/2015  8:08 AM 09/15/2015  7:30 PM Full Code [id number]  Marcos Eke, PA-C ED    Questions for Most Recent Historical Code Status (Order [id number])    Question Answer Comment   In the event of cardiac or respiratory ARREST Do not call a "code blue"    In the event of cardiac or respiratory ARREST Do not perform Intubation, CPR, defibrillation or ACLS    In the event of cardiac or respiratory ARREST Use medication by any route, position, wound care, and other measures to relive pain and suffering. May use oxygen, suction and manual treatment of airway obstruction as needed for comfort.        Chief Complaint  Patient presents with  . Discharge Note    HISTORY OF PRESENT ILLNESS:  This is an 82 year old male who is for discharge with Home health PT for endurance, OT for ADLs, CNA for showers and Skilled Nurse for medication management.   He has been admitted to [facility] on 11/15/15 from [facility] with HCAP. He was started antibiotics.  Patient was admitted to this facility for short-term rehabilitation after the patient's recent hospitalization.  Patient has completed SNF rehabilitation and therapy has cleared the patient for discharge.   PAST MEDICAL HISTORY:  Past Medical History:  Diagnosis Date  . Acute psychosis   . Anemia   . Anticoagulated on Coumadin   . Aortic stenosis   . Atrial fibrillation (HCC)   . Bilateral edema of lower extremity   . CAP (community acquired pneumonia)   . Chronic depression   .  Closed fracture of right hip with routine healing   . Current use of long term anticoagulation   . Fracture of right hip (HCC)   . Glaucoma   . Hearing loss   . Heart disease   . Hypertension   . Hypokalemia   . Irregular heartbeat   . Pain, acute due to trauma   . Physical deconditioning   . Pleural effusion   . Postoperative anemia due to acute blood loss   . Presence of permanent cardiac pacemaker    Medtronic; placed October 2014  . Stroke [facility])      CURRENT MEDICATIONS: Reviewed  Patient's Medications  New Prescriptions   No medications on file  Previous Medications   ACETAMINOPHEN (TYLENOL) 500 MG TABLET    Take 1,000 mg by mouth 2 (two) times daily.   FERROUS SULFATE 325 (65 FE) MG TABLET    Take 325 mg by mouth 2 (two) times daily with a meal.   FUROSEMIDE (LASIX) 40 MG TABLET    Take 40 mg by mouth daily. Hold if SBP <110   HYDROCODONE-ACETAMINOPHEN (NORCO) 7.5-325 MG TABLET    Take 1 tablet by mouth every 8 (eight) hours as needed for moderate pain.   LISINOPRIL (PRINIVIL,ZESTRIL) 20 MG TABLET    Take 20 mg by mouth daily. Hold if SBP <100  POTASSIUM CHLORIDE (K-DUR) 10 MEQ TABLET    Take 10 mEq by mouth every Monday, Wednesday, and Friday. Give M-W-F   PROTEIN (PROCEL) POWD    Take 2 scoop by mouth 2 (two) times daily.   SERTRALINE (ZOLOFT) 50 MG TABLET    Take 50 mg by mouth daily.   TIMOLOL (BETIMOL) 0.5 % OPHTHALMIC SOLUTION    Place 1 drop into the right eye daily.   WARFARIN (COUMADIN) 4 MG TABLET    Take 4 mg by mouth daily at 6 PM.   Modified Medications   No medications on file  Discontinued Medications   AMOXICILLIN-CLAVULANATE (AUGMENTIN) 875-125 MG TABLET    Take 1 tablet by mouth every 12 (twelve) hours. Last day 4/29.     No Known Allergies   REVIEW OF SYSTEMS:  GENERAL: no change in appetite, no fatigue, no weight changes, no fever, chills or weakness EYES: Denies change in vision, dry eyes, eye pain, itching or discharge EARS: Denies change  in hearing, ringing in ears, or earache NOSE: Denies nasal congestion or epistaxis MOUTH and THROAT: Denies oral discomfort, gingival pain or bleeding, pain from teeth or hoarseness   RESPIRATORY: no cough, SOB, DOE, wheezing, hemoptysis CARDIAC: no chest pain, edema or palpitations GI: no abdominal pain, diarrhea, constipation, heart burn, nausea or vomiting GU: Denies dysuria, frequency, hematuria, incontinence, or discharge PSYCHIATRIC: Denies feeling of depression or anxiety. No report of hallucinations, insomnia, paranoia, or agitation    PHYSICAL EXAMINATION  GENERAL APPEARANCE: Well nourished. In no acute distress. Normal body habitus SKIN:  Skin is warm and dry.  HEAD: Normal in size and contour. No evidence of trauma EYES: Lids open and close normally. No blepharitis, entropion or ectropion. PERRL. Conjunctivae are clear and sclerae are white. Lenses are without opacity EARS: Pinnae are normal. Patient hears normal voice tunes of the examiner MOUTH and THROAT: Lips are without lesions. Oral mucosa is moist and without lesions. Tongue is normal in shape, size, and color and without lesions NECK: supple, trachea midline, no neck masses, no thyroid tenderness, no thyromegaly LYMPHATICS: no LAN in the neck, no supraclavicular LAN RESPIRATORY: breathing is even & unlabored, BS CTAB CARDIAC: RRR, no murmur,no extra heart sounds, pacemaker on left chest GI: abdomen soft, normal BS, no masses, no tenderness, no hepatomegaly, no splenomegaly EXTREMITIES:  Able to move X 4 extremities, BLE has unna boots PSYCHIATRIC: Alert to self, confused to time and place. Affect and behavior are appropriate  LABS/RADIOLOGY: Labs reviewed: Basic Metabolic Panel:  Recent Labs  09/81/19 1340 11/13/15 0310 11/14/15 11/14/15 0335  NA 141 142 140 140  K 4.4 3.9  --  3.4*  CL 105 109  --  107  CO2 22 24  --  23  GLUCOSE 82 117*  --  102*  BUN CREATININE 0.79 0.88 0.7 0.72  CALCIUM  8.9 8.5*  --  8.1*  MG  --  1.9  --   --    Liver Function Tests:  Recent Labs  11/12/15 1340 11/13/15 0310 11/14/15 0335  AST 23 17 13*  ALT 8* 9* 7*  ALKPHOS 103 82 75  BILITOT 2.6* 2.1* 1.9*  PROT 7.3 6.2* 5.9*  ALBUMIN 3.3* 2.8* 2.5*    Recent Labs  11/12/15 1512  LIPASE 28    CBC:  Recent Labs  10/26/15 11/12/15 1340 11/13/15 0310 11/14/15 0335  WBC 3.4 5.2 4.8 4.4  NEUTROABS 1 3.1 2.7  --   HGB 9.1*  10.0* 9.6* 9.2*  HCT 29* 31.3* 30.6* 28.7*  MCV  --  94.3 94.4 92.6  PLT 179 132* 110* 111*   CBG:  Recent Labs  11/13/15 1635 11/13/15 2124 11/14/15 0607  GLUCAP 111* 102* 91      ASSESSMENT/PLAN:  Physical deconditioning - for Home health PT, OT, CNA and Skilled Nurse; fall precaution  HCAP - finished course of Augmentin; resolved  Anemia of chronic disease - stable; continue FeSO4 325 mg 1 tab PO BID Lab Results  Component Value Date   HGB 9.2 (L) 11/14/2015   Lymphedema of legs - continue Lasix 40 mg 1 tab PO Q D and bilateral unna boots  Thrombocytopenia - no bleeding nor bruising noted Lab Results  Component Value Date   PLT 111 (L) 11/14/2015    Protein-calorie malnutrition, severe  - albumin 2.5 ; continue Procel 2 scoops PO BID  Chronic Depression - mood is stable; continue Sertraline 50 mg 1 tab PO Q D  Hypokalemia - continue KCL ER 10 meq 1 tab PO Q M-W-F Lab Results  Component Value Date   K 3.4 (L) 11/14/2015   Hypertension - well-controlled; continue Lisinopril 20 mg 1 tab PO Q D  Glaucoma - no complaints of eye pain; continue Timolol 0.5 % 1 gtt into right eye daoly  Atrial Fibrillation - rate-controlled; continue Coumadin 4 mg 1 tab PO Q D   Long-term use of anticoagulant - INR 1.9 ; continue Coumadin 4 mg 1 tab PO Q D and re-check INR 11/21/15  Dementia - continue supportive care; fall precaution      I have filled out patient's discharge paperwork and written prescriptions.  Patient will receive home health  PT, OT, Skilled Nurse and CNA.   DME provided:  None   Total discharge time: Greate than 30 minutes Greater than 50% was spent in counseling and coordination of care with the patient.   Discharge time involved coordination of the discharge process with social worker, nursing staff and therapy department. Medical justification for home health services verified.     Kenard GowerMonina Medina-Vargas, NP [facility] [phone]

## 2016-06-21 DEATH — deceased

## 2018-01-17 IMAGING — CR DG HIP (WITH OR WITHOUT PELVIS) 2-3V*R*
4 series · 4 of 4 positions shown · non-contrast
Comparison: None.

CLINICAL DATA: Status post fall, with right hip pain. Initial
encounter.

EXAM:
DG HIP (WITH OR WITHOUT PELVIS) 2-3V RIGHT

[pelvis ap (1 of 2)]
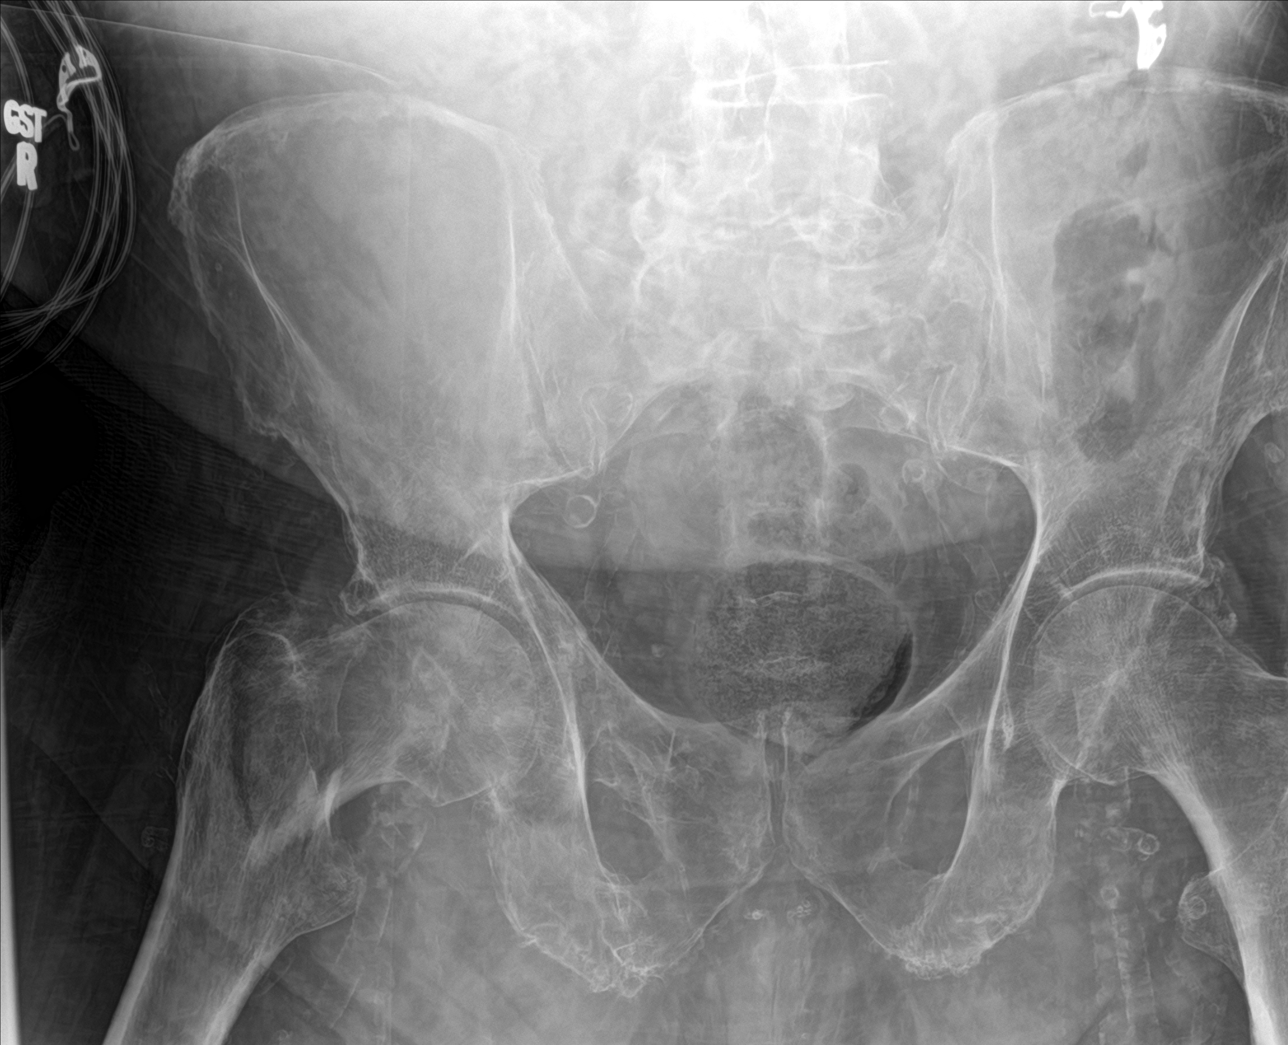

[hip ap]
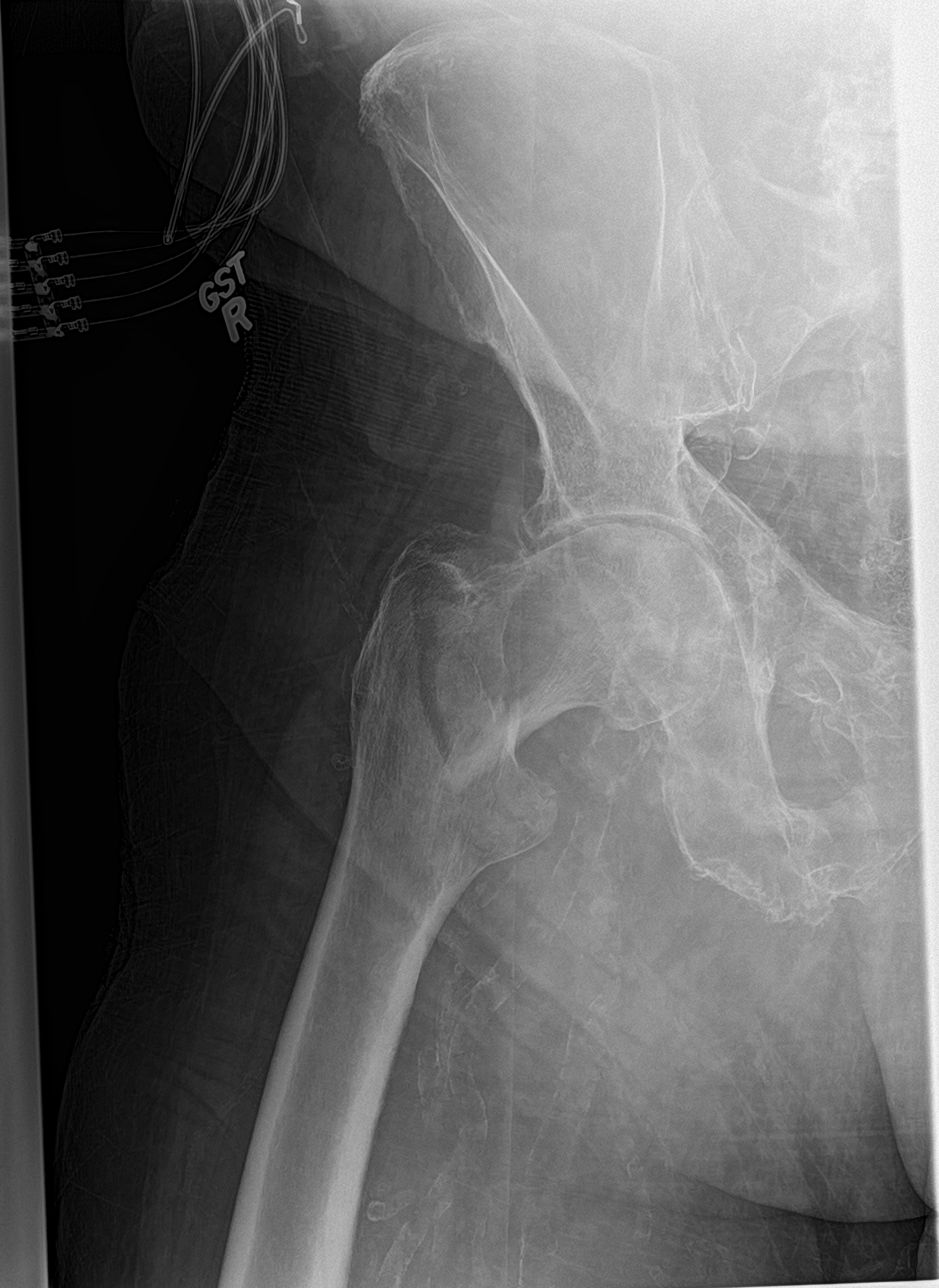

[hip lat]
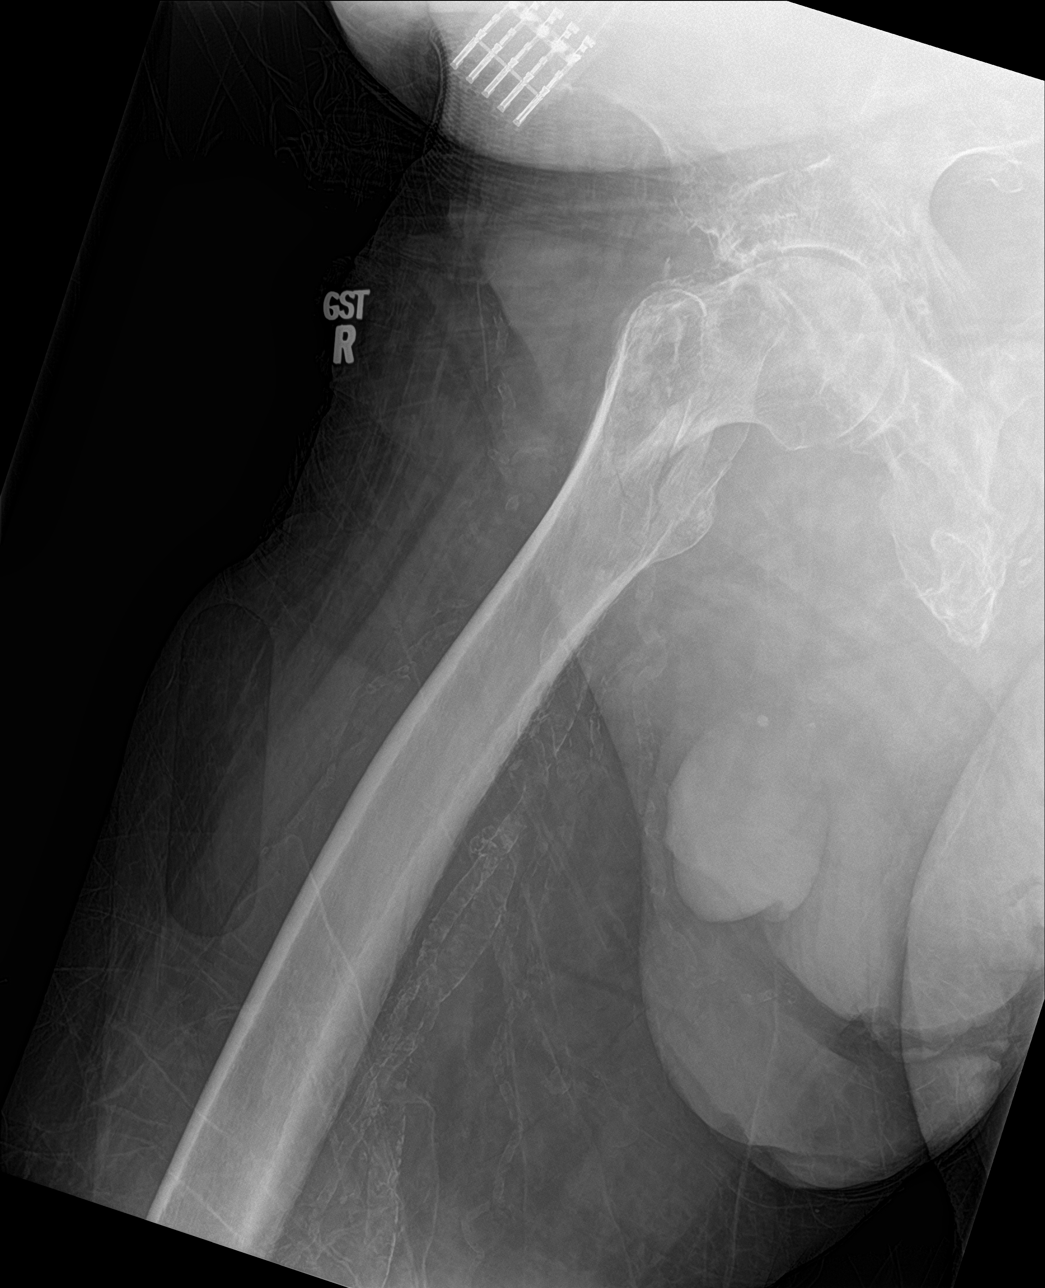

[pelvis ap (2 of 2)]
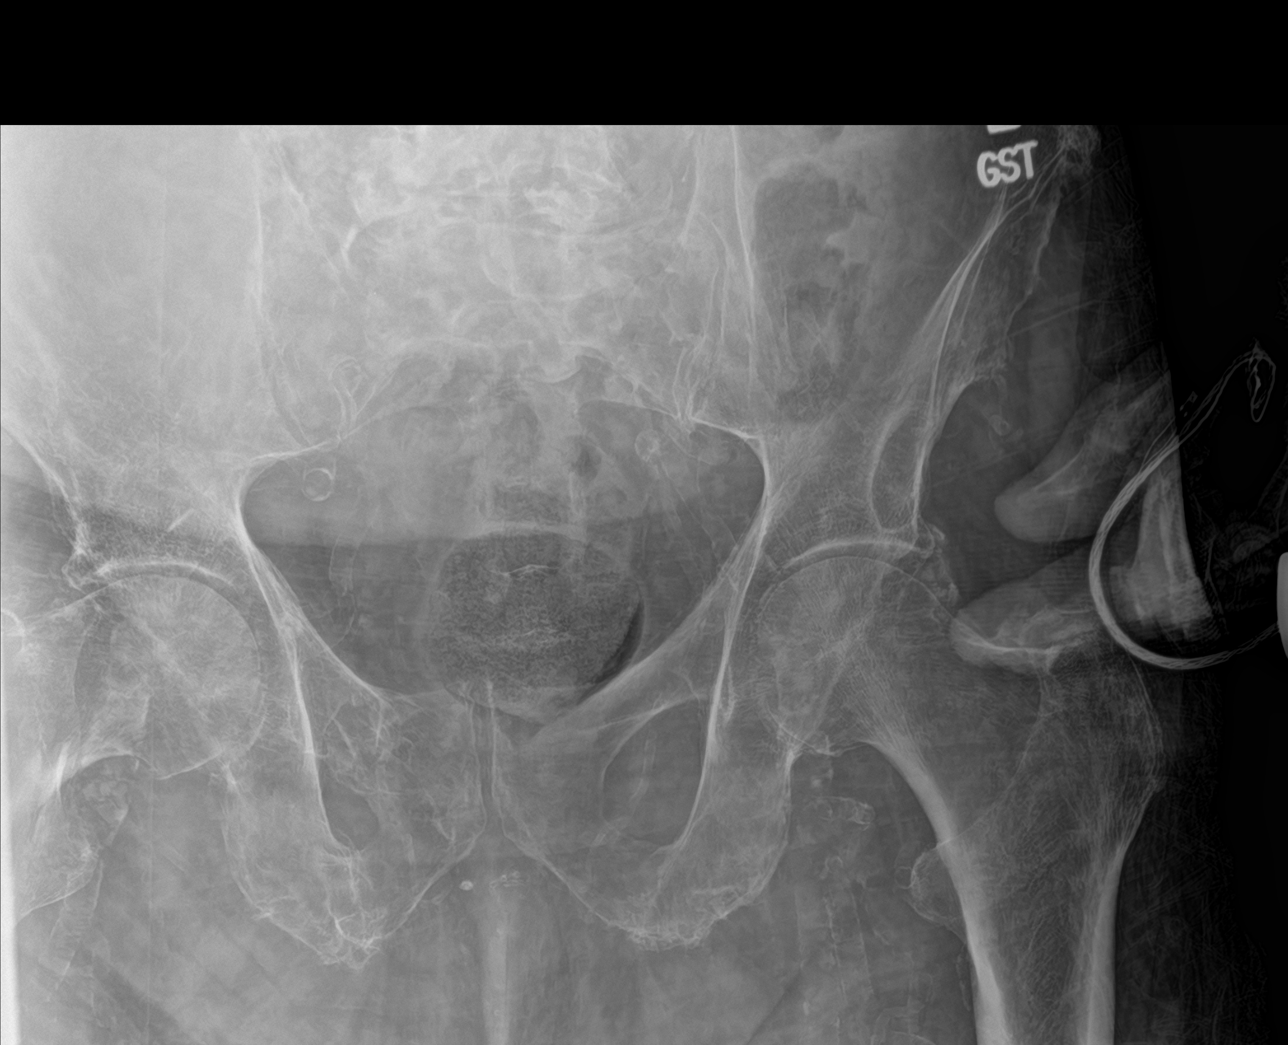

[4 of 4 positions shown; findings below may reference images not displayed]

FINDINGS: There is a mildly displaced right femoral intertrochanteric
fracture, with mild medial angulation. No additional fractures are
seen. The right femoral head remains seated at the acetabulum. Axial
joint space narrowing is noted at the left hip.

The sacroiliac joints are grossly unremarkable. Mild degenerative
change is noted at the lower lumbar spine. Diffuse vascular
calcifications are seen. Mild chronic deformity is noted at the
right superior and inferior pubic rami.
IMPRESSION: 1. Mildly displaced right femoral intertrochanteric fracture, with
mild medial angulation.
2. Diffuse vascular calcifications seen.

## 2018-01-17 IMAGING — CT CT HEAD W/O CM
4 series · 18 of 30 positions shown, 19 images · non-contrast
Comparison: None.

CLINICAL DATA: Status post fall, hitting back of head. Patient on
Coumadin. Headache. Initial encounter.

EXAM:
CT HEAD WITHOUT CONTRAST
TECHNIQUE: Contiguous axial images were obtained from the base of the skull
through the vertex without intravenous contrast.

[Series 201: head w/o, idose (1) · axial · non-contrast · 0.57mm/px · z∈[+72,+162]mm · 4 of 32 slices shown, 5 images (1 of 2)]
[im 7/32  brain]
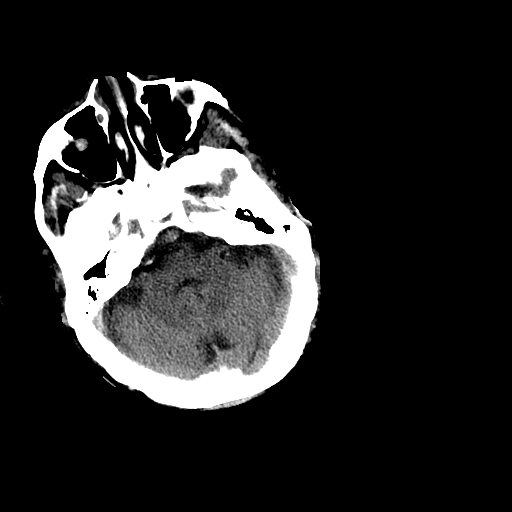
[im 7/32  bone]
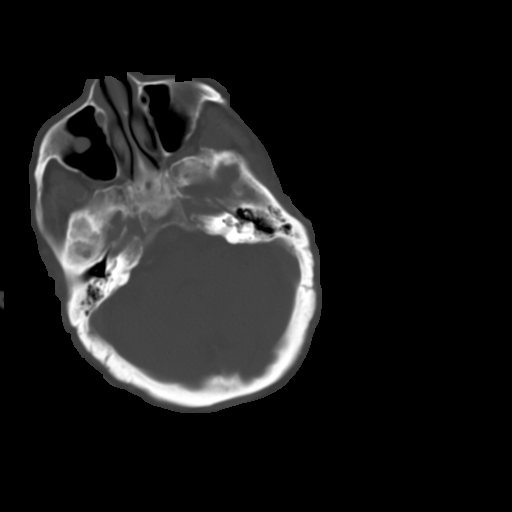
[im 13/32  brain]
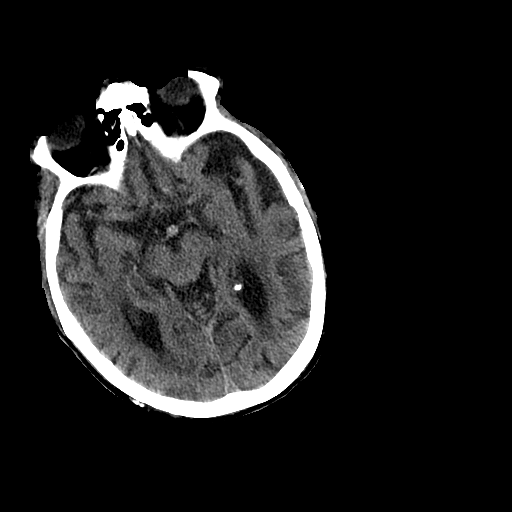
[im 19/32  brain]
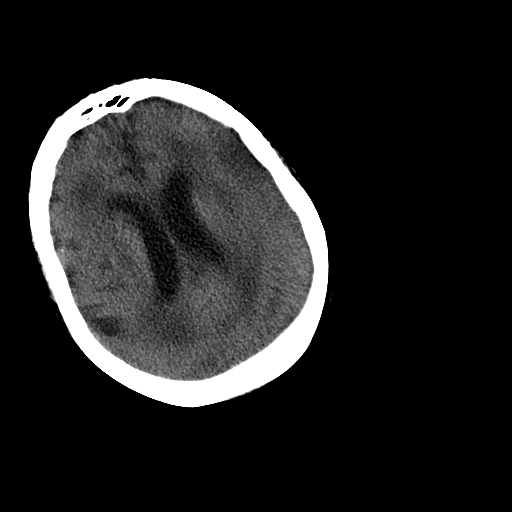
[im 25/32  brain]
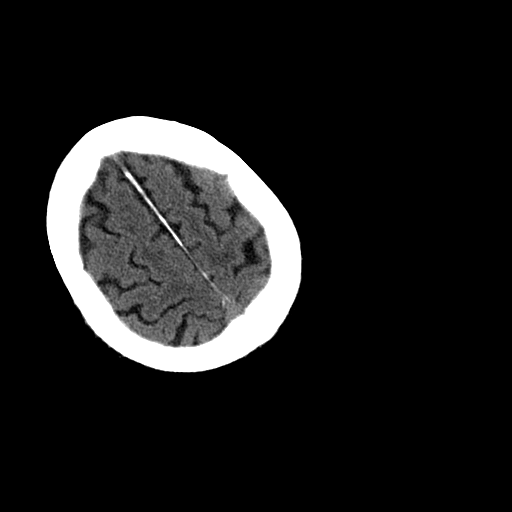

[Series 202: head w/o bone, idose (1) · axial · non-contrast · 0.57mm/px · z∈[+58,+181]mm · 8 of 64 slices shown (1 of 2)]
[im 8/64  bone]
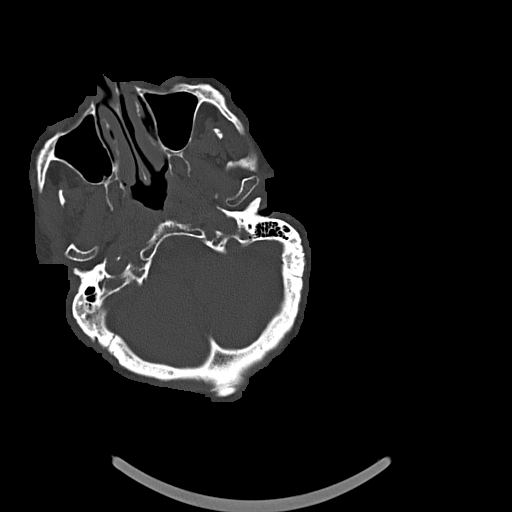
[im 15/64  bone]
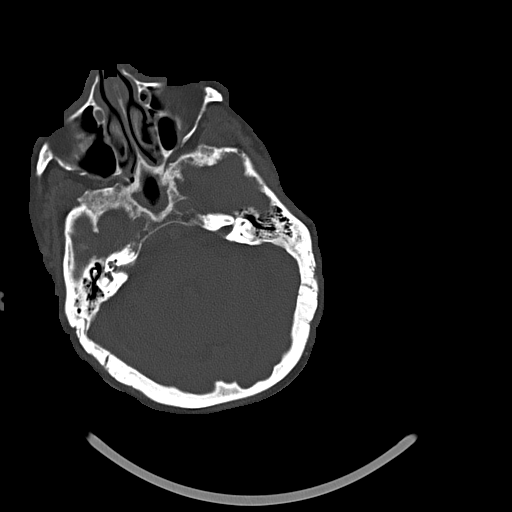
[im 22/64  bone]
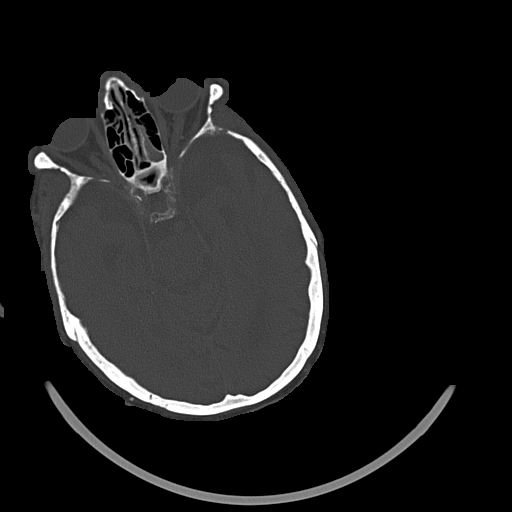
[im 29/64  bone]
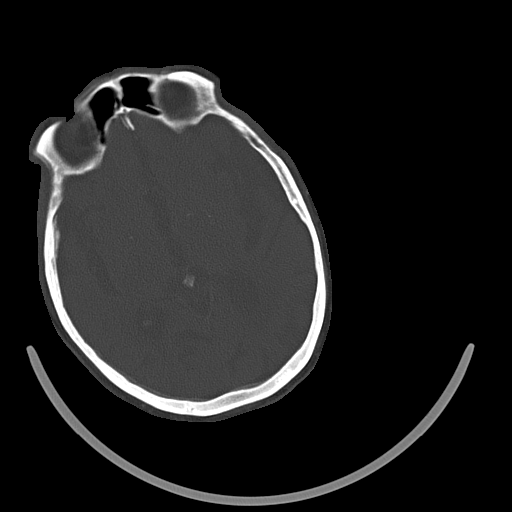
[im 36/64  bone]
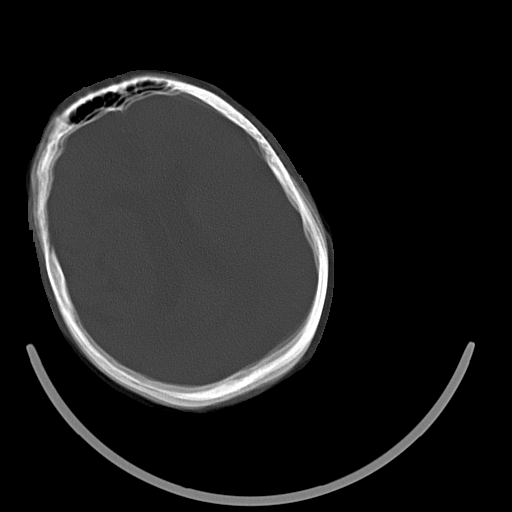
[im 43/64  bone]
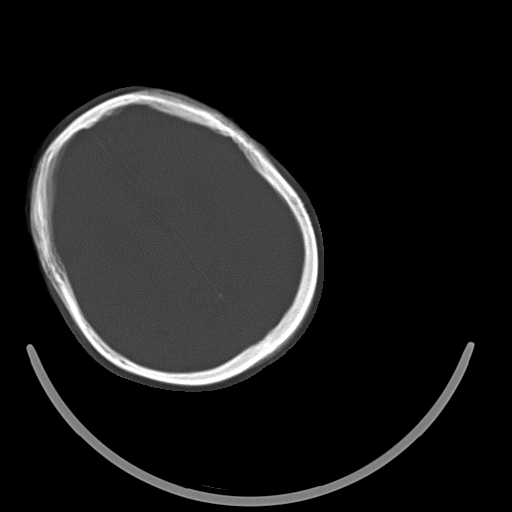
[im 50/64  bone]
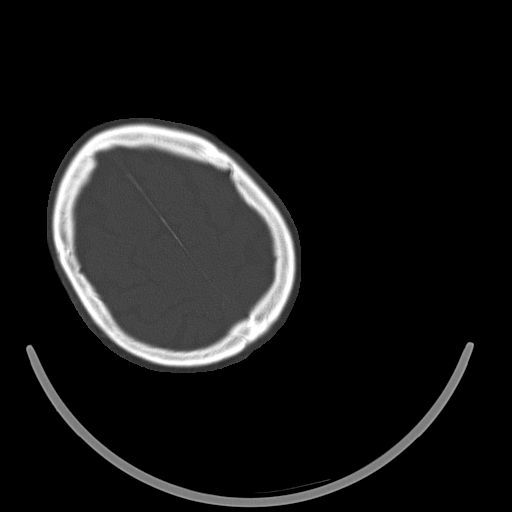
[im 57/64  bone]
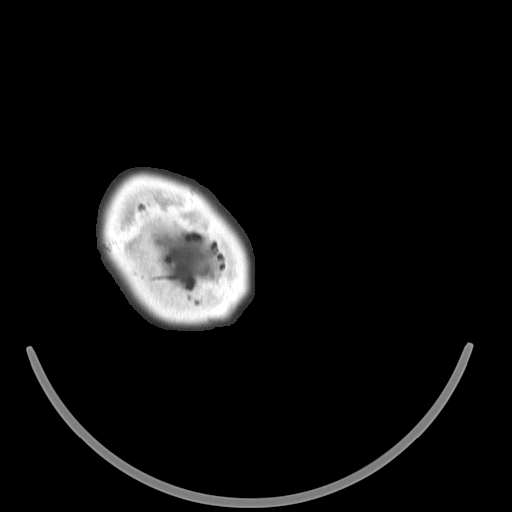

[Series 301: head w/o, idose (1) · axial · non-contrast · 0.57mm/px · z∈[+122,+157]mm · 2 of 23 slices shown (2 of 2)]
[im 8/23  brain]
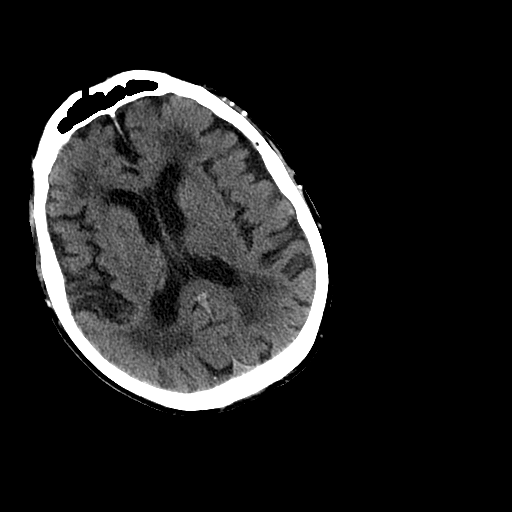
[im 15/23  brain]
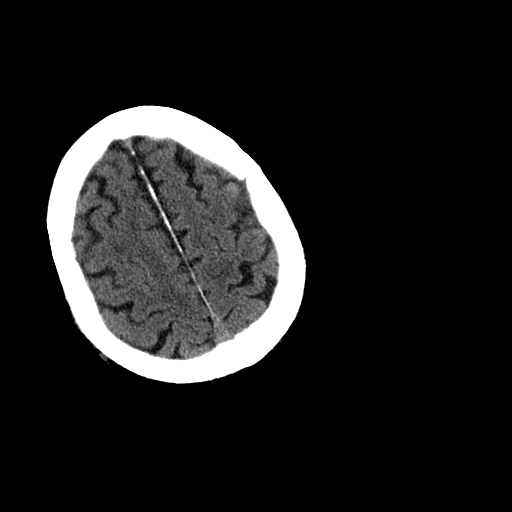

[Series 302: head w/o bone, idose (1) · axial · non-contrast · 0.57mm/px · z∈[+58,+111]mm · 4 of 64 slices shown (2 of 2)]
[im 8/64  bone]
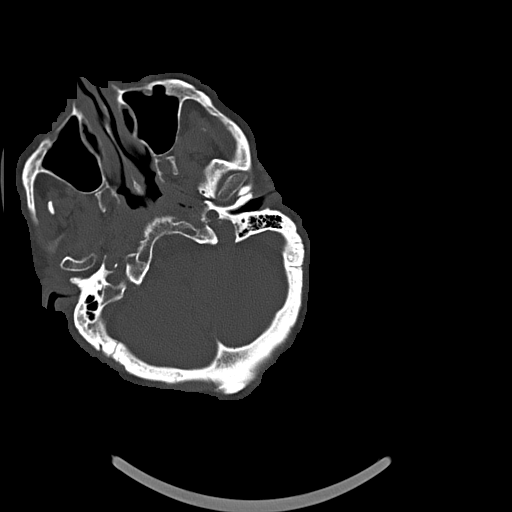
[im 15/64  bone]
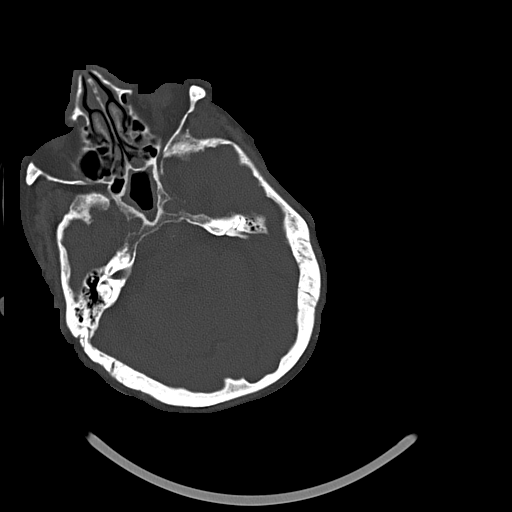
[im 22/64  bone]
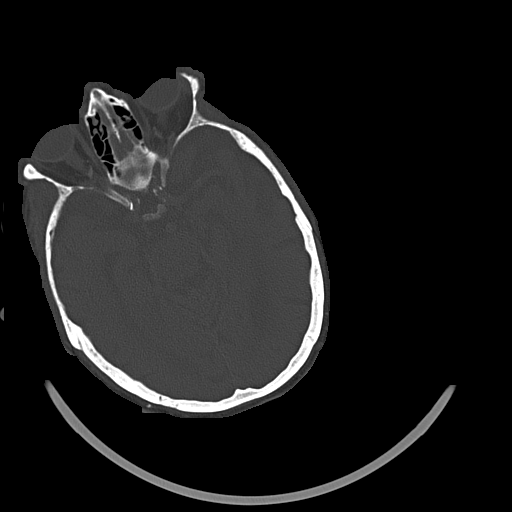
[im 29/64  bone]
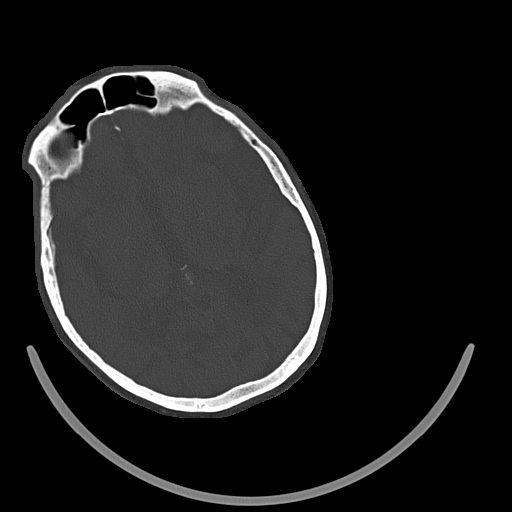

[18 of 30 positions shown; findings below may reference images not displayed]

FINDINGS: There is no evidence of acute infarction, mass lesion, or intra- or
extra-axial hemorrhage on CT.

Prominence of the ventricles and sulci reflects mild to moderate
cortical volume loss. Diffuse periventricular and subcortical white
matter change likely reflects small vessel ischemic microangiopathy.
Cerebellar atrophy is noted. Mild chronic ischemic change is noted
at the right basal ganglia.

The brainstem and fourth ventricle are within normal limits. The
cerebral hemispheres demonstrate grossly normal gray-white
differentiation. No mass effect or midline shift is seen.

There is no evidence of fracture; visualized osseous structures are
unremarkable in appearance. The orbits are within normal limits.
Mucoperiosteal thickening is noted at the sphenoid sinus. The
remaining paranasal sinuses and mastoid air cells are well-aerated.
No significant soft tissue abnormalities are seen.
IMPRESSION: 1. No evidence of traumatic intracranial injury or fracture.
2. Mild to moderate cortical volume and diffuse small vessel
ischemic microangiopathy.
3. Mild chronic ischemic change at the right basal ganglia.
4. Mucoperiosteal thickening at the sphenoid sinus.
